# Patient Record
Sex: Female | Born: 1985 | Race: White | Hispanic: No | Marital: Married | Smoking: Never smoker
Health system: Southern US, Community
[De-identification: ages and names within clinical notes are randomized; demographics above are authoritative.]

## PROBLEM LIST (undated history)

## (undated) ENCOUNTER — Inpatient Hospital Stay: Payer: Self-pay

## (undated) DIAGNOSIS — O26843 Uterine size-date discrepancy, third trimester: Secondary | ICD-10-CM

## (undated) DIAGNOSIS — J309 Allergic rhinitis, unspecified: Secondary | ICD-10-CM

## (undated) DIAGNOSIS — Z98891 History of uterine scar from previous surgery: Secondary | ICD-10-CM

## (undated) DIAGNOSIS — J45909 Unspecified asthma, uncomplicated: Secondary | ICD-10-CM

## (undated) DIAGNOSIS — T8859XA Other complications of anesthesia, initial encounter: Secondary | ICD-10-CM

## (undated) DIAGNOSIS — K851 Biliary acute pancreatitis without necrosis or infection: Secondary | ICD-10-CM

## (undated) DIAGNOSIS — R945 Abnormal results of liver function studies: Secondary | ICD-10-CM

## (undated) DIAGNOSIS — K219 Gastro-esophageal reflux disease without esophagitis: Secondary | ICD-10-CM

## (undated) DIAGNOSIS — Z9049 Acquired absence of other specified parts of digestive tract: Secondary | ICD-10-CM

## (undated) DIAGNOSIS — K810 Acute cholecystitis: Secondary | ICD-10-CM

## (undated) DIAGNOSIS — T4145XA Adverse effect of unspecified anesthetic, initial encounter: Secondary | ICD-10-CM

## (undated) HISTORY — DX: Biliary acute pancreatitis without necrosis or infection: K85.10

## (undated) HISTORY — DX: Uterine size-date discrepancy, third trimester: O26.843

## (undated) HISTORY — PX: BREAST REDUCTION SURGERY: SHX8

## (undated) HISTORY — DX: Acquired absence of other specified parts of digestive tract: Z90.49

## (undated) HISTORY — PX: BREAST SURGERY: SHX581

## (undated) HISTORY — DX: Acute cholecystitis: K81.0

## (undated) HISTORY — DX: Abnormal results of liver function studies: R94.5

## (undated) HISTORY — DX: Allergic rhinitis, unspecified: J30.9

## (undated) HISTORY — DX: History of uterine scar from previous surgery: Z98.891

## (undated) HISTORY — PX: HAND TENDON SURGERY: SHX663

---

## 2010-12-02 ENCOUNTER — Emergency Department (HOSPITAL_COMMUNITY)
Admission: EM | Admit: 2010-12-02 | Discharge: 2010-12-02 | Payer: Self-pay | Source: Home / Self Care | Admitting: Emergency Medicine

## 2011-07-14 DIAGNOSIS — Z7189 Other specified counseling: Secondary | ICD-10-CM | POA: Insufficient documentation

## 2011-07-14 DIAGNOSIS — J309 Allergic rhinitis, unspecified: Secondary | ICD-10-CM

## 2011-07-14 HISTORY — DX: Allergic rhinitis, unspecified: J30.9

## 2012-05-24 ENCOUNTER — Inpatient Hospital Stay: Payer: Self-pay | Admitting: Obstetrics and Gynecology

## 2012-05-25 LAB — CBC WITH DIFFERENTIAL/PLATELET
Eosinophil #: 0.3 10*3/uL (ref 0.0–0.7)
Eosinophil %: 2.8 %
HCT: 35.6 % (ref 35.0–47.0)
Lymphocyte #: 3.2 10*3/uL (ref 1.0–3.6)
MCHC: 34.3 g/dL (ref 32.0–36.0)
MCV: 96 fL (ref 80–100)
Monocyte #: 0.6 x10 3/mm (ref 0.2–0.9)
Monocyte %: 6.2 %
Neutrophil #: 6.3 10*3/uL (ref 1.4–6.5)
RBC: 3.73 10*6/uL — ABNORMAL LOW (ref 3.80–5.20)
RDW: 13.5 % (ref 11.5–14.5)
WBC: 10.4 10*3/uL (ref 3.6–11.0)

## 2012-05-26 LAB — HEMATOCRIT: HCT: 31.8 % — ABNORMAL LOW (ref 35.0–47.0)

## 2012-05-31 LAB — PATHOLOGY REPORT

## 2014-03-13 ENCOUNTER — Encounter: Payer: Self-pay | Admitting: Podiatry

## 2014-03-13 ENCOUNTER — Ambulatory Visit (INDEPENDENT_AMBULATORY_CARE_PROVIDER_SITE_OTHER): Payer: BC Managed Care – PPO | Admitting: Podiatry

## 2014-03-13 VITALS — Ht 65.0 in | Wt 200.0 lb

## 2014-03-13 DIAGNOSIS — B079 Viral wart, unspecified: Secondary | ICD-10-CM

## 2014-03-13 NOTE — Progress Notes (Signed)
Subjective:     Patient ID: Lucienne MinksBrianne Turri, female   DOB: 04/03/1986, 28 y.o.   MRN: [id number]  HPI patient presents with painful lesion plantar aspect right foot that has been present for a couple months. [redacted] weeks pregnant and having trouble walking on this   Review of Systems  All other systems reviewed and are negative.       Objective:   Physical Exam  Nursing note and vitals reviewed. Constitutional: She is oriented to person, place, and time.  Cardiovascular: Intact distal pulses.   Musculoskeletal: Normal range of motion.  Neurological: She is oriented to person, place, and time.  Skin: Skin is warm.   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 Patient will reappoint if it continues to persist

## 2014-03-13 NOTE — Progress Notes (Signed)
   Subjective:    Patient ID: Amber Page, female    DOB: [birth year], 28 y.o.   MRN: [id number]  HPI Comments: Left foot pain , callus on the bottom of the 3rd met. She is [redacted] weeks pregnant. And the callus is making it hard to walk      Review of Systems  All other systems reviewed and are negative.       Objective:   Physical Exam        Assessment & Plan:

## 2014-04-16 ENCOUNTER — Ambulatory Visit: Payer: Self-pay | Admitting: Obstetrics and Gynecology

## 2014-04-16 LAB — CBC WITH DIFFERENTIAL/PLATELET
BASOS PCT: 0.4 %
Basophil #: 0 10*3/uL (ref 0.0–0.1)
Eosinophil #: 0.2 10*3/uL (ref 0.0–0.7)
Eosinophil %: 1.5 %
HCT: 36.8 % (ref 35.0–47.0)
HGB: 11.9 g/dL — ABNORMAL LOW (ref 12.0–16.0)
Lymphocyte #: 2.7 10*3/uL (ref 1.0–3.6)
Lymphocyte %: 23 %
MCH: 30.7 pg (ref 26.0–34.0)
MCHC: 32.4 g/dL (ref 32.0–36.0)
MCV: 95 fL (ref 80–100)
MONO ABS: 0.6 x10 3/mm (ref 0.2–0.9)
Monocyte %: 5.6 %
NEUTROS ABS: 8 10*3/uL — AB (ref 1.4–6.5)
NEUTROS PCT: 69.5 %
Platelet: 360 10*3/uL (ref 150–440)
RBC: 3.88 10*6/uL (ref 3.80–5.20)
RDW: 14.1 % (ref 11.5–14.5)
WBC: 11.6 10*3/uL — AB (ref 3.6–11.0)

## 2014-04-17 ENCOUNTER — Inpatient Hospital Stay: Payer: Self-pay

## 2014-04-17 LAB — CBC WITH DIFFERENTIAL/PLATELET
Basophil #: 0.1 10*3/uL (ref 0.0–0.1)
Basophil %: 0.4 %
EOS PCT: 1.4 %
Eosinophil #: 0.2 10*3/uL (ref 0.0–0.7)
HCT: 37.1 % (ref 35.0–47.0)
HGB: 12.6 g/dL (ref 12.0–16.0)
LYMPHS ABS: 2.7 10*3/uL (ref 1.0–3.6)
Lymphocyte %: 19.9 %
MCH: 32.3 pg (ref 26.0–34.0)
MCHC: 34.1 g/dL (ref 32.0–36.0)
MCV: 95 fL (ref 80–100)
Monocyte #: 0.8 x10 3/mm (ref 0.2–0.9)
Monocyte %: 6.1 %
NEUTROS ABS: 9.8 10*3/uL — AB (ref 1.4–6.5)
Neutrophil %: 72.2 %
Platelet: 328 10*3/uL (ref 150–440)
RBC: 3.92 10*6/uL (ref 3.80–5.20)
RDW: 14 % (ref 11.5–14.5)
WBC: 13.5 10*3/uL — ABNORMAL HIGH (ref 3.6–11.0)

## 2014-04-18 LAB — HEMATOCRIT: HCT: 31.2 % — AB (ref 35.0–47.0)

## 2014-10-19 ENCOUNTER — Encounter: Payer: Self-pay | Admitting: Podiatry

## 2015-04-11 NOTE — Op Note (Signed)
PATIENT NAME:  Amber Page, [name] MR#:  [id number] DATE OF BIRTH:  1986/10/15  DATE OF PROCEDURE:  05/25/2012  PREOPERATIVE DIAGNOSIS:  1. Intrauterine pregnancy at 39 weeks 0 days gestation.  2. Fetal macrosomia.  3. Chorioamnionitis.  4. Fetal intolerance to labor.   POSTOPERATIVE DIAGNOSIS:  1. Intrauterine pregnancy at 39 weeks 0 days gestation.  2. Fetal macrosomia.  3. Chorioamnionitis.  4. Fetal intolerance to labor.   PRIMARY SURGEON: Florina Oundreas M. Bonney AidStaebler, MD   ASSISTANT: Surgical Tech Kandice HamsAmanda Ore   ANESTHESIA: Spinal.   ESTIMATED BLOOD LOSS: 900 mL.   INTRAOPERATIVE FLUIDS: 1600 ML of crystalloid.   URINE OUTPUT: 100 mL.   INTRAOPERATIVE FINDINGS: Amber MartinLiveborn female infant weighing 4300 grams, 9 pounds 8 ounces, Apgars 9 and 9. The intra-abdominal anatomy, uterus, tubes, and ovaries are grossly unremarkable.   COMPLICATIONS: None.   PROCEDURE IN DETAIL: The risks, benefits, and alternatives of the procedure were discussed with the patient prior to proceeding to the Operating Room. The patient was suspected of carrying a macrosomic fetus and during the course of her labor developed clinical signs of chorioamnionitis. Her T-max was 100.3, but the fetus started displaying prolonged tachycardia with minimal variability and deep decelerations with pushing. The patient was allowed to push for approximately 40 minutes with no movement of the fetal vertex and station noted. Given that she was deemed remote from delivery, and delivery itself was not certain given the size of the uterus, after discussion with the mother the mutual decision was made to proceed with an operative cesarean delivery. She was taken back to the Operating Room and placed under spinal anesthesia. After the level of anesthesia was noted to be adequate, she was prepped and draped in the usual sterile fashion. A Pfannenstiel skin incision was made two fingerbreadths close to the pubic symphysis and carried down sharply to the  rectus fascia using a knife. The fascia was incised and the fascial incision extended using Mayo scissors. The superior border of the rectus fascia was grasped with two Kocher clamps. The underlying rectus muscle was dissected off the rectus fascia bluntly. The median raphe was incised using Mayo scissors. The inferior border of the rectus muscle was then grasped with the Kocher clamps and the rectus muscle and median raphe incised in a similar fashion. The midline was identified, grasped, and the peritoneum was tented up using a hemostat. The peritoneum was then entered using Metzenbaum scissors. The peritoneal incision was extended using manual traction. Next, a bladder blade was placed. The lower uterine segment was identified, and a bladder flap was created using Metzenbaum scissors and blunt dissection. The bladder blade was placed. Hysterotomy incision was made using a knife. The uterus was entered bluntly using the operator's finger. Hysterotomy incision was extended using manual traction. The operator's hand was placed into the uterus. The infant's vertex was grasped, flexed, brought to the incision, and delivered atraumatically using fundal pressure. The infant was suctioned, the cord was clamped and cut, and the infant was then passed to the awaiting pediatricians. The uterus was exteriorized and wiped clean of clots and debris. The hysterotomy incision was repaired using a two-layer closure of 0 Vicryl with the first layer being a running locked stitch, and the second imbricating layer a horizontal imbricating stitch was utilized. The uterus was then returned to the abdomen. The hysterotomy incision was reinspected and noted to be hemostatic. The peritoneal gutters were wiped clean of clots and debris using two moist laps. The peritoneum was then closed  using a 2-0 Vicryl in a running fashion. Next, the superior border of the rectus fascia was grasped with Kocher clamp. The On-Q trocars were introduced in  the midline approximately 3 cm above the superior border of the Pfannenstiel skin incision. The trocars were removed from the introducers, and the catheters were threaded through the introducers subfascially. Next, the fascia was closed using a looped #1 PDS in a running fashion. Hemostasis was achieved using the Bovie. Skin was closed using a 4-0 Monocryl in a subcuticular fashion. The On-Q catheters were then primed with 5 mL of 0.5% bupivacaine each. Sponge, needle, and instrument counts were correct x2. The patient tolerated the procedure well and was taken to the recovery room in stable condition.  ____________________________ Florina Ou. Bonney Aid, MD ams:cbb D: 05/27/2012 09:40:28 ET T: 05/27/2012 11:34:32 ET JOB#: [id number] Lorrene Reid MD ELECTRONICALLY SIGNED 05/28/2012 22:03

## 2015-04-27 NOTE — H&P (Signed)
L&D Evaluation:  History Expanded:  HPI [name], EDD of 04/16/14 per 8 wk US, presents at 3447w1d with c/o ctx since 0230 this am. Pt with h/o LTCS on of 9# 8oz fetus on 05/25/12 after 2nd stage arrest after 2 hours of pushing, FITL and chorioamnionitis. Pt also had PP wound dehiscence. Pt desires TOLAC, VBAC consent signed at the office on 12/30/13. [facility], EFW at 35 wks: 7#7oz (85%). TWG this pregnancy: 32 lbs (most recent weight showed 10 lb drop) TDAP given in pregnancy.   Blood Type (Maternal) O positive   Group B Strep Results Maternal (Result >5wks must be treated as unknown) positive   Maternal HIV Negative   Maternal Syphilis Ab Nonreactive   Maternal Varicella Immune   Rubella Results (Maternal) immune   Maternal T-Dap Immune   Presents with contractions   Patient's Medical History Asthma   Patient's Surgical History Previous C-Section  Breast reduction, hand surgery   Medications Pre Natal Vitamins  Advair, Zantac   Allergies NKDA   Social History none   Exam:  Vital Signs BP 149/92, 131/75    General no apparent distress   Mental Status clear   Chest clear   Heart no murmur/gallop/rubs   Abdomen gravid, tender with contractions   Estimated Fetal Weight Large for gestational age   Pelvic 6 cm   Mebranes Intact   FHT normal rate with no decels   Ucx regular   Impression:  Impression active labor   Plan:  Comments Pt strongly desires Trial of Labor, Initiate VBAC protocol -MD, anesthesia and OR will be made aware. Pt desiring natural labor, husband and doula offering support. Discussed continuous monitoring with the exception of bathroom privileges, positions changes are fine.  NPO Ampicillin for +GBS at approximately 0630   Electronic Signatures: Amber Page, Amber Page (CNM)  (Signed 01-May-15 07:07)  Authored: L&D Evaluation   Last Updated: 01-May-15 07:07 by Amber Page, Amber Page (CNM)

## 2015-05-31 LAB — OB RESULTS CONSOLE RUBELLA ANTIBODY, IGM: RUBELLA: IMMUNE

## 2015-05-31 LAB — OB RESULTS CONSOLE GC/CHLAMYDIA
Chlamydia: NEGATIVE
GC PROBE AMP, GENITAL: NEGATIVE

## 2015-05-31 LAB — OB RESULTS CONSOLE VARICELLA ZOSTER ANTIBODY, IGG: VARICELLA IGG: IMMUNE

## 2015-05-31 LAB — OB RESULTS CONSOLE HEPATITIS B SURFACE ANTIGEN: HEP B S AG: NEGATIVE

## 2015-09-09 ENCOUNTER — Other Ambulatory Visit: Payer: Self-pay | Admitting: Obstetrics and Gynecology

## 2015-09-09 DIAGNOSIS — R1031 Right lower quadrant pain: Secondary | ICD-10-CM

## 2015-09-10 ENCOUNTER — Ambulatory Visit
Admission: RE | Admit: 2015-09-10 | Discharge: 2015-09-10 | Disposition: A | Discharge status: Home / Self Care | Payer: BC Managed Care – PPO | Source: Ambulatory Visit | Attending: Obstetrics and Gynecology | Admitting: Obstetrics and Gynecology

## 2015-09-10 DIAGNOSIS — Z3A19 19 weeks gestation of pregnancy: Secondary | ICD-10-CM | POA: Diagnosis not present

## 2015-09-10 DIAGNOSIS — O26892 Other specified pregnancy related conditions, second trimester: Secondary | ICD-10-CM | POA: Diagnosis not present

## 2015-09-10 DIAGNOSIS — R1031 Right lower quadrant pain: Secondary | ICD-10-CM | POA: Insufficient documentation

## 2015-10-29 ENCOUNTER — Encounter: Payer: Self-pay | Admitting: *Deleted

## 2015-10-29 ENCOUNTER — Emergency Department
Admission: EM | Admit: 2015-10-29 | Discharge: 2015-10-29 | Disposition: A | Discharge status: Home / Self Care | Payer: BC Managed Care – PPO | Attending: Emergency Medicine | Admitting: Emergency Medicine

## 2015-10-29 ENCOUNTER — Emergency Department: Payer: BC Managed Care – PPO

## 2015-10-29 DIAGNOSIS — Z7951 Long term (current) use of inhaled steroids: Secondary | ICD-10-CM | POA: Insufficient documentation

## 2015-10-29 DIAGNOSIS — Z79899 Other long term (current) drug therapy: Secondary | ICD-10-CM | POA: Diagnosis not present

## 2015-10-29 DIAGNOSIS — Y9289 Other specified places as the place of occurrence of the external cause: Secondary | ICD-10-CM | POA: Insufficient documentation

## 2015-10-29 DIAGNOSIS — W1849XA Other slipping, tripping and stumbling without falling, initial encounter: Secondary | ICD-10-CM | POA: Diagnosis not present

## 2015-10-29 DIAGNOSIS — Y998 Other external cause status: Secondary | ICD-10-CM | POA: Insufficient documentation

## 2015-10-29 DIAGNOSIS — Y9301 Activity, walking, marching and hiking: Secondary | ICD-10-CM | POA: Diagnosis not present

## 2015-10-29 DIAGNOSIS — S93492A Sprain of other ligament of left ankle, initial encounter: Secondary | ICD-10-CM | POA: Diagnosis not present

## 2015-10-29 DIAGNOSIS — S99912A Unspecified injury of left ankle, initial encounter: Secondary | ICD-10-CM | POA: Diagnosis present

## 2015-10-29 HISTORY — DX: Unspecified asthma, uncomplicated: J45.909

## 2015-10-29 NOTE — ED Notes (Signed)
Ice applied. Ankle elevated

## 2015-10-29 NOTE — Discharge Instructions (Signed)
Ankle Sprain °An ankle sprain is an injury to the strong, fibrous tissues (ligaments) that hold the bones of your ankle joint together.  °CAUSES °An ankle sprain is usually caused by a fall or by twisting your ankle. Ankle sprains most commonly occur when you step on the outer edge of your foot, and your ankle turns inward. People who participate in sports are more prone to these types of injuries.  °SYMPTOMS  °· Pain in your ankle. The pain may be present at rest or only when you are trying to stand or walk. °· Swelling. °· Bruising. Bruising may develop immediately or within 1 to 2 days after your injury. °· Difficulty standing or walking, particularly when turning corners or changing directions. °DIAGNOSIS  °Your caregiver will ask you details about your injury and perform a physical exam of your ankle to determine if you have an ankle sprain. During the physical exam, your caregiver will press on and apply pressure to specific areas of your foot and ankle. Your caregiver will try to move your ankle in certain ways. An X-ray exam may be done to be sure a bone was not broken or a ligament did not separate from one of the bones in your ankle (avulsion fracture).  °TREATMENT  °Certain types of braces can help stabilize your ankle. Your caregiver can make a recommendation for this. Your caregiver may recommend the use of medicine for pain. If your sprain is severe, your caregiver may refer you to a surgeon who helps to restore function to parts of your skeletal system (orthopedist) or a physical therapist. °HOME CARE INSTRUCTIONS  °· Apply ice to your injury for 1-2 days or as directed by your caregiver. Applying ice helps to reduce inflammation and pain. °· Put ice in a plastic bag. °· Place a towel between your skin and the bag. °· Leave the ice on for 15-20 minutes at a time, every 2 hours while you are awake. °· Only take over-the-counter or prescription medicines for pain, discomfort, or fever as directed by  your caregiver. °· Elevate your injured ankle above the level of your heart as much as possible for 2-3 days. °· If your caregiver recommends crutches, use them as instructed. Gradually put weight on the affected ankle. Continue to use crutches or a cane until you can walk without feeling pain in your ankle. °· If you have a plaster splint, wear the splint as directed by your caregiver. Do not rest it on anything harder than a pillow for the first 24 hours. Do not put weight on it. Do not get it wet. You may take it off to take a shower or bath. °· You may have been given an elastic bandage to wear around your ankle to provide support. If the elastic bandage is too tight (you have numbness or tingling in your foot or your foot becomes cold and blue), adjust the bandage to make it comfortable. °· If you have an air splint, you may blow more air into it or let air out to make it more comfortable. You may take your splint off at night and before taking a shower or bath. Wiggle your toes in the splint several times per day to decrease swelling. °SEEK MEDICAL CARE IF:  °· You have rapidly increasing bruising or swelling. °· Your toes feel extremely cold or you lose feeling in your foot. °· Your pain is not relieved with medicine. °SEEK IMMEDIATE MEDICAL CARE IF: °· Your toes are numb or blue. °·   You have severe pain that is increasing. °MAKE SURE YOU:  °· Understand these instructions. °· Will watch your condition. °· Will get help right away if you are not doing well or get worse. °  °This information is not intended to replace advice given to you by your health care provider. Make sure you discuss any questions you have with your health care provider. °  °Document Released: 12/04/2005 Document Revised: 12/25/2014 Document Reviewed: 12/16/2011 °Elsevier Interactive Patient Education ©2016 Elsevier Inc. ° °Cryotherapy °Cryotherapy means treatment with cold. Ice or gel packs can be used to reduce both pain and swelling.  Ice is the most helpful within the first 24 to 48 hours after an injury or flare-up from overusing a muscle or joint. Sprains, strains, spasms, burning pain, shooting pain, and aches can all be eased with ice. Ice can also be used when recovering from surgery. Ice is effective, has very few side effects, and is safe for most people to use. °PRECAUTIONS  °Ice is not a safe treatment option for people with: °· Raynaud phenomenon. This is a condition affecting small blood vessels in the extremities. Exposure to cold may cause your problems to return. °· Cold hypersensitivity. There are many forms of cold hypersensitivity, including: °¨ Cold urticaria. Red, itchy hives appear on the skin when the tissues begin to warm after being iced. °¨ Cold erythema. This is a red, itchy rash caused by exposure to cold. °¨ Cold hemoglobinuria. Red blood cells break down when the tissues begin to warm after being iced. The hemoglobin that carry oxygen are passed into the urine because they cannot combine with blood proteins fast enough. °· Numbness or altered sensitivity in the area being iced. °If you have any of the following conditions, do not use ice until you have discussed cryotherapy with your caregiver: °· Heart conditions, such as arrhythmia, angina, or chronic heart disease. °· High blood pressure. °· Healing wounds or open skin in the area being iced. °· Current infections. °· Rheumatoid arthritis. °· Poor circulation. °· Diabetes. °Ice slows the blood flow in the region it is applied. This is beneficial when trying to stop inflamed tissues from spreading irritating chemicals to surrounding tissues. However, if you expose your skin to cold temperatures for too long or without the proper protection, you can damage your skin or nerves. Watch for signs of skin damage due to cold. °HOME CARE INSTRUCTIONS °Follow these tips to use ice and cold packs safely. °· Place a dry or damp towel between the ice and skin. A damp towel will  cool the skin more quickly, so you may need to shorten the time that the ice is used. °· For a more rapid response, add gentle compression to the ice. °· Ice for no more than 10 to 20 minutes at a time. The bonier the area you are icing, the less time it will take to get the benefits of ice. °· Check your skin after 5 minutes to make sure there are no signs of a poor response to cold or skin damage. °· Rest 20 minutes or more between uses. °· Once your skin is numb, you can end your treatment. You can test numbness by very lightly touching your skin. The touch should be so light that you do not see the skin dimple from the pressure of your fingertip. When using ice, most people will feel these normal sensations in this order: cold, burning, aching, and numbness. °· Do not use ice on someone who   cannot communicate their responses to pain, such as small children or people with dementia. °HOW TO MAKE AN ICE PACK °Ice packs are the most common way to use ice therapy. Other methods include ice massage, ice baths, and cryosprays. Muscle creams that cause a cold, tingly feeling do not offer the same benefits that ice offers and should not be used as a substitute unless recommended by your caregiver. °To make an ice pack, do one of the following: °· Place crushed ice or a bag of frozen vegetables in a sealable plastic bag. Squeeze out the excess air. Place this bag inside another plastic bag. Slide the bag into a pillowcase or place a damp towel between your skin and the bag. °· Mix 3 parts water with 1 part rubbing alcohol. Freeze the mixture in a sealable plastic bag. When you remove the mixture from the freezer, it will be slushy. Squeeze out the excess air. Place this bag inside another plastic bag. Slide the bag into a pillowcase or place a damp towel between your skin and the bag. °SEEK MEDICAL CARE IF: °· You develop white spots on your skin. This may give the skin a blotchy (mottled) appearance. °· Your skin turns  blue or pale. °· Your skin becomes waxy or hard. °· Your swelling gets worse. °MAKE SURE YOU:  °· Understand these instructions. °· Will watch your condition. °· Will get help right away if you are not doing well or get worse. °  °This information is not intended to replace advice given to you by your health care provider. Make sure you discuss any questions you have with your health care provider. °  °Document Released: 07/31/2011 Document Revised: 12/25/2014 Document Reviewed: 07/31/2011 °Elsevier Interactive Patient Education ©2016 Elsevier Inc. ° °Stirrup Ankle Brace °Stirrup ankle braces give support and help stabilize the ankle joint. They are rigid pieces of plastic or fiberglass that go up both sides of the lower leg with the bottom of the stirrup fitting comfortably under the bottom of the instep of the foot. It can be held on with Velcro straps or an elastic wrap. Stirrup ankle braces are used to support the ankle following mild or moderate sprains or strains, or fractures after cast removal.  °They can be easily removed or adjusted if there is swelling. The rigid brace shells are designed to fit the ankle comfortably and provide the needed medial/lateral stabilization. This brace can be easily worn with most athletic shoes. The brace liner is usually made of a soft, comfortable gel-like material. This gel fits the ankle well without causing uncomfortable pressure points.  °IMPORTANCE OF ANKLE BRACES: °· The use of ankle bracing is effective in the prevention of ankle sprains. °· In athletes, the use of ankle bracing will offer protection and prevent further sprains. °· Research shows that a complete rehabilitation program needs to be included with external bracing. This includes range of motion and ankle strengthening exercises. Your caregivers will instruct you in this. °If you were given the brace today for a new injury, use the following home care instructions as a guide. °HOME CARE INSTRUCTIONS   °· Apply ice to the sore area for 15-20 minutes, 03-04 times per day while awake for the first 2 days. Put the ice in a plastic bag and place a towel between the bag of ice and your skin. Never place the ice pack directly on your skin. Be especially careful using ice on an elbow or knee or other bony area, such as   your ankle, because icing for too long may damage the nerves which are close to the surface. °· Keep your leg elevated when possible to lessen swelling. °· Wear your splint until you are seen for a follow-up examination. Do not put weight on it. Do not get it wet. You may take it off to take a shower or bath. °· For Activity: Use crutches with non-weight bearing for 1 week. Then, you may walk on your ankle as instructed. Start gradually with weight bearing on the affected ankle. °· Continue to use crutches or a cane until you can stand on your ankle without causing pain. °· Wiggle your toes in the splint several times per day if you are able. °· The splint is too tight if you have numbness, tingling, or if your foot becomes cold and blue. Adjust the straps or elastic bandage to make it comfortable. °· Only take over-the-counter or prescription medicines for pain, discomfort, or fever as directed by your caregiver. °SEEK IMMEDIATE MEDICAL CARE IF:  °· You have increased bruising, swelling or pain. °· Your toes are blue or cold and loosening the brace or wrap does not help. °· Your pain is not relieved with medicine. °MAKE SURE YOU:  °· Understand these instructions. °· Will watch your condition. °· Will get help right away if you are not doing well or get worse. °  °This information is not intended to replace advice given to you by your health care provider. Make sure you discuss any questions you have with your health care provider. °  °Document Released: 10/04/2004 Document Revised: 02/26/2012 Document Reviewed: 07/06/2015 °Elsevier Interactive Patient Education ©2016 Elsevier Inc. ° °Elastic Bandage and  RICE °WHAT DOES AN ELASTIC BANDAGE DO? °Elastic bandages come in different shapes and sizes. They generally provide support to your injury and reduce swelling while you are healing, but they can perform different functions. Your health care provider will help you to decide what is best for your protection, recovery, or rehabilitation following an injury. °WHAT ARE SOME GENERAL TIPS FOR USING AN ELASTIC BANDAGE? °· Use the bandage as directed by the maker of the bandage that you are using. °· Do not wrap the bandage too tightly. This may cut off the circulation in the arm or leg in the area below the bandage. °¨ If part of your body beyond the bandage becomes blue, numb, cold, swollen, or is more painful, your bandage is most likely too tight. If this occurs, remove your bandage and reapply it more loosely. °· See your health care provider if the bandage seems to be making your problems worse rather than better. °· An elastic bandage should be removed and reapplied every 3-4 hours or as directed by your health care provider. °WHAT IS RICE? °The routine care of many injuries includes rest, ice, compression, and elevation (RICE therapy).  °Rest °Rest is required to allow your body to heal. Generally, you can resume your routine activities when you are comfortable and have been given permission by your health care provider. °Ice °Icing your injury helps to keep the swelling down and it reduces pain. Do not apply ice directly to your skin. °· Put ice in a plastic bag. °· Place a towel between your skin and the bag. °· Leave the ice on for 20 minutes, 2-3 times per day. °Do this for as long as you are directed by your health care provider. °Compression °Compression helps to keep swelling down, gives support, and helps with discomfort. Compression may   be done with an elastic bandage. Elevation Elevation helps to reduce swelling and it decreases pain. If possible, your injured area should be placed at or above the level  of your heart or the center of your chest. WHEN SHOULD I SEEK MEDICAL CARE? You should seek medical care if:  You have persistent pain and swelling.  Your symptoms are getting worse rather than improving. These symptoms may indicate that further evaluation or further X-rays are needed. Sometimes, X-rays may not show a small broken bone (fracture) until a number of days later. Make a follow-up appointment with your health care provider. Ask when your X-ray results will be ready. Make sure that you get your X-ray results. WHEN SHOULD I SEEK IMMEDIATE MEDICAL CARE? You should seek immediate medical care if:  You have a sudden onset of severe pain at or below the area of your injury.  You develop redness or increased swelling around your injury.  You have tingling or numbness at or below the area of your injury that does not improve after you remove the elastic bandage.   This information is not intended to replace advice given to you by your health care provider. Make sure you discuss any questions you have with your health care provider.   Document Released: 05/26/2002 Document Revised: 08/25/2015 Document Reviewed: 07/20/2014 Elsevier Interactive Patient Education [employer]2016 Else[employer] Inc.

## 2015-10-29 NOTE — ED Notes (Signed)
Pt stepped in grass, twisted L ankle and heard a snap. Pt presents w/ c/o L ankle pain, swelling, unable to bear.

## 2015-10-29 NOTE — ED Provider Notes (Signed)
[facility] Emergency Department Provider Note  ____________________________________________  Time seen: Approximately 9:10 PM  I have reviewed the triage vital signs and the nursing notes.   HISTORY  Chief Complaint Ankle Injury    HPI Amber Page is a 29 y.o. female who presents emergency department complaining of left ankle pain. She states that she was walking this evening through some high grass when she stepped "wrong" and states that she inverted her left ankle. She states that she heard and felt a "snapping sensation". She experienced immediate pain and edema to the lateral malleolus. The patient was unable to bear weight at time of injury. She denies any numbness or tingling. She denies any other injury. The pain is constant, sharp, worsened with palpation or weightbearing. Patient has not taken medication prior to arrival.   Past Medical History  Diagnosis Date  . Asthma     There are no active problems to display for this patient.   Past Surgical History  Procedure Laterality Date  . Cesarean section      x 2  . Breast surgery      Current Outpatient Rx  Name  Route  Sig  Dispense  Refill  . Fluticasone-Salmeterol (ADVAIR) 250-50 MCG/DOSE AEPB   Inhalation   Inhale 1 puff into the lungs 2 (two) times daily.         . Prenatal Multivit-Min-Fe-FA (PRENATAL VITAMINS PO)   Oral   Take by mouth.         . ranitidine (ZANTAC) 75 MG tablet   Oral   Take 75 mg by mouth 2 (two) times daily.           Allergies Review of patient's allergies indicates no known allergies.  History reviewed. No pertinent family history.  Social History Social History  Substance Use Topics  . Smoking status: Never Smoker   . Smokeless tobacco: Never Used  . Alcohol Use: No    Review of Systems Constitutional: No fever/chills Eyes: No visual changes. ENT: No sore throat. Cardiovascular: Denies chest pain. Respiratory: Denies shortness of  breath. Gastrointestinal: No abdominal pain.  No nausea, no vomiting.  No diarrhea.  No constipation. Genitourinary: Negative for dysuria. Musculoskeletal: Negative for back pain. Endorses left ankle pain. Skin: Negative for rash. Neurological: Negative for headaches, focal weakness or numbness.  10-point ROS otherwise negative.  ____________________________________________   PHYSICAL EXAM:  VITAL SIGNS: ED Triage Vitals  Enc Vitals Group     BP 10/29/15 2022 144/83 mmHg     Pulse Rate 10/29/15 2022 107     Resp 10/29/15 2022 22     Temp 10/29/15 2022 98 F (36.7 C)     Temp Source 10/29/15 2022 Oral     SpO2 10/29/15 2022 100 %     Weight --      Height --      Head Cir --      Peak Flow --      Pain Score 10/29/15 2018 9     Pain Loc --      Pain Edu? --      Excl. in GC? --     Constitutional: Alert and oriented. Well appearing and in no acute distress. Eyes: Conjunctivae are normal. PERRL. EOMI. Head: Atraumatic. Nose: No congestion/rhinnorhea. Mouth/Throat: Mucous membranes are moist.  Oropharynx non-erythematous. Neck: No stridor.   Cardiovascular: Normal rate, regular rhythm. Grossly normal heart sounds.  Good peripheral circulation. Respiratory: Normal respiratory effort.  No retractions. Lungs CTAB. Gastrointestinal:  Soft and nontender. No distention. No abdominal bruits. No CVA tenderness. Musculoskeletal: Edema of the left ankle is noted when compared with the right side. Edema is located on the lateral malleolus. No visible deformity noted. Patient is tender to palpation over the lateral malleolus as well as surrounding soft tissue. No tenderness to palpation over the base of the fifth metatarsal. Sensation and pulses intact distally. Neurologic:  Normal speech and language. No gross focal neurologic deficits are appreciated. No gait instability. Skin:  Skin is warm, dry and intact. No rash noted. Psychiatric: Mood and affect are normal. Speech and behavior  are normal.  ____________________________________________   LABS (all labs ordered are listed, but only abnormal results are displayed)  Labs Reviewed - No data to display ____________________________________________  EKG   ____________________________________________  RADIOLOGY  Left ankle x-ray Impression: No acute fracture, dislocation, or joint effusion. ____________________________________________   PROCEDURES  Procedure(s) performed: None  Critical Care performed: No  ____________________________________________   INITIAL IMPRESSION / ASSESSMENT AND PLAN / ED COURSE  Pertinent labs & imaging results that were available during my care of the patient were reviewed by me and considered in my medical decision making (see chart for details).  I discussed the findings with the patient and likely diagnosis of posterior talofibular ankle sprain. The patient is pregnant and I advised her to take Tylenol as symptom control medication. Possible options of immobilization were discussed the patient and the patient opts for over-the-counter brace. Patient is advised to follow-up with orthopedics for further evaluation and treatment and she verbalizes compliance with this plan. ____________________________________________   FINAL CLINICAL IMPRESSION(S) / ED DIAGNOSES  Final diagnoses:  Sprain of posterior talofibular ligament of ankle, left, initial encounter      Racheal Patches, PA-C 10/30/15 0040  Jennye Moccasin, MD 10/31/15 1318

## 2015-12-07 ENCOUNTER — Encounter: Payer: BC Managed Care – PPO | Attending: Obstetrics and Gynecology | Admitting: Dietician

## 2015-12-07 ENCOUNTER — Encounter: Payer: Self-pay | Admitting: Dietician

## 2015-12-07 VITALS — Ht 65.0 in | Wt 221.3 lb

## 2015-12-07 DIAGNOSIS — E669 Obesity, unspecified: Secondary | ICD-10-CM

## 2015-12-07 NOTE — Patient Instructions (Signed)
Balance breakfast with 2 servings of carbohydrate (starch and milk group; no fruit) and protein.  Balance lunch and dinner with 3-4 servings of carbohydrate, protein and non-starchy vegetables. Estimate carbohydrate servings at meals and snacks. Measure some portions of carbohydrate foods, especially starches. Read labels for carbohydrate grams remembering that 15 gms of carbohydrate equals one serving. Include a starch or fruit and a protein for afternoon snack. Discontinue beverages with sugar. Use regular oatmeal with nuts added.

## 2015-12-07 NOTE — Progress Notes (Signed)
Medical Nutrition Therapy: Visit start time: 1315  end time: 1420 Assessment:  Diagnosis: obesity Past medical history: Patient is [redacted] weeks gestation; has 3 yr.old and 5420 month old. GGT had 1 abnormal value and value at 1 hour was 170 (highest number without being abnormal) Psychosocial issues/ stress concerns: Patient describes her stress as "moderate" and indicates "very well" as to how she is dealing with her stress. Preferred learning method:  . Auditory . Hands-on Current weight: 221.2 lbs  Height: 65 in Medications, supplements: see list Progress and evaluation:  Patient in for initial nutrition assessment visit. 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 She is on holiday break presently but plans to return to school for 5 weeks after break. She reports that she has made an effort to make healthier food choices and based on information given, she is making some healthy choices at meal times. A problem area is when she comes in from school and states "I snack on anything available- chips, cookies, etc." She also includes sweetened coffee and at least 1 glass of sweet tea daily. She also eats some type of sweet snack 2 times per day. Physical activity: none  Dietary Intake:  Usual eating pattern includes 3 meals and 2 snacks per day. Dining out frequency: 0-2 meals per week.  Breakfast: oatmeal (maple/br. Sugar) 1 pkg, flavored coffee or egg/cheese biscuit Snack: cottage cheese/fruit Lunch: salad or soup or peanut butter/jelly sandwich with fruit or yogurt, chips or cookie, water or sweet tea Snack: chips, cookies, water Supper: usually chicken with pasta or sweet potato and vegetables or eggs and toast,  Snack: usually no snack after dinner Beverages: coffee, water, tea  Nutrition Care Education: Borderline Gestational diabetes: Instructed on a meal plan based on  2000 calories including dietary principles for gestational diabetes, carbohydrate counting and the need to balance carbohydrate, protein, fat and non-starchy vegetables.Gave and reviewed menu examples. Discussed snack choices in afternoon that would have less effect on blood sugar. Food Safety: reviewed ways to lower risk of food borne illness and listeriosis.    Nutritional Diagnosis:  NI-5.8.2 Excessive carbohydrate intake As related to frequent carbohydrate snacks and sweetened beverages.  As evidenced by diet history.  Intervention:  Balance breakfast with 2 servings of carbohydrate (starch and milk group; no fruit) and protein.  Balance lunch and dinner with 3-4 servings of carbohydrate, protein and non-starchy vegetables. Estimate carbohydrate servings at meals and snacks. Measure some portions of carbohydrate foods, especially starches. Read labels for carbohydrate grams remembering that 15 gms of carbohydrate equals one serving. Include a starch or fruit and a protein for afternoon snack. Discontinue beverages with sugar. Use regular oatmeal with nuts added.  Education Materials given:  . General diet guidelines for Gestational Diabetes . Food lists/ Planning A Balanced Meal . Sample meal pattern/ menus . Goals/ instructions Learner/ who was taught:  . Patient  Level of understanding: . Partial understanding; needs review/ practice Learning barriers: . None Willingness to learn/ readiness for change: . Eager, change in progress  Monitoring and Evaluation:  No follow-up scheduled. Patient to call if desires further help with diet/nutrition.

## 2016-01-01 ENCOUNTER — Encounter: Payer: Self-pay | Admitting: *Deleted

## 2016-01-01 ENCOUNTER — Observation Stay
Admission: EM | Admit: 2016-01-01 | Discharge: 2016-01-01 | Disposition: A | Discharge status: Home / Self Care | Payer: BC Managed Care – PPO | Attending: Obstetrics & Gynecology | Admitting: Obstetrics & Gynecology

## 2016-01-01 DIAGNOSIS — O98519 Other viral diseases complicating pregnancy, unspecified trimester: Principal | ICD-10-CM | POA: Insufficient documentation

## 2016-01-01 DIAGNOSIS — Z3A00 Weeks of gestation of pregnancy not specified: Secondary | ICD-10-CM | POA: Insufficient documentation

## 2016-01-01 DIAGNOSIS — O219 Vomiting of pregnancy, unspecified: Secondary | ICD-10-CM

## 2016-01-01 LAB — URINALYSIS COMPLETE WITH MICROSCOPIC (ARMC ONLY)
Bilirubin Urine: NEGATIVE
Glucose, UA: NEGATIVE mg/dL
Hgb urine dipstick: NEGATIVE
Nitrite: NEGATIVE
PH: 5 (ref 5.0–8.0)
PROTEIN: 30 mg/dL — AB
Specific Gravity, Urine: 1.024 (ref 1.005–1.030)

## 2016-01-01 MED ORDER — LACTATED RINGERS IV SOLN
Freq: Once | INTRAVENOUS | Status: AC
  Administered 2016-01-01: 08:00:00 via INTRAVENOUS

## 2016-01-01 NOTE — OB Triage Note (Signed)
N/V started Wednesday. Denies N/V Friday. N/V returned today. Denies N/V during pregnancy. Reports loose stools as well. Reports household having the stomach bug. Elaina HoopsElks, Isam Unrein S

## 2016-01-01 NOTE — Discharge Summary (Signed)
Physician Discharge Summary  Patient ID: Amber Page MRN: [id number] DOB/AGE: [birth year] 29 y.o.  Admit date: 01/01/2016 Discharge date: 01/01/2016  Admission Diagnoses:  Nausea and Vomiting during pregnancy secondary to viral illness  Discharge Diagnoses:  Active Problems:   Nausea and vomiting during pregnancy   Discharged Condition: good  Hospital Course: Observation, Fetal monitoring  Significant Diagnostic Studies:  Urinalysis    Component Value Date/Time   COLORURINE AMBER* 01/01/2016 0828   APPEARANCEUR HAZY* 01/01/2016 0828   LABSPEC 1.024 01/01/2016 0828   PHURINE 5.0 01/01/2016 0828   GLUCOSEU NEGATIVE 01/01/2016 0828   HGBUR NEGATIVE 01/01/2016 0828   BILIRUBINUR NEGATIVE 01/01/2016 0828   KETONESUR 1+* 01/01/2016 0828   PROTEINUR 30* 01/01/2016 0828   NITRITE NEGATIVE 01/01/2016 0828   LEUKOCYTESUR TRACE* 01/01/2016 0828    Treatments: IV fluids- 1000 ml LR  Discharge Exam: Blood pressure 116/74, pulse 98, temperature 98.1 F (36.7 C), temperature source Oral, resp. rate 16, height 5\' 5"  (1.651 m), weight 97.523 kg (215 lb), SpO2 97 %. General: Alert, Oriented, NAD HR: RRR, no murmurs Lungs: CTA bilaterally, no additional work of breathing Extremities: no evidence of DVT, no edema Abdomen: Gravid, non tender Toco: Occasional contractions Fetal Well Being: Baseline 140, moderate variability, + accelerations, variable x2  Disposition: 01-Home or Self Care  Fetal well being: reassuring, Cat 1 tracing      Medication List    TAKE these medications        albuterol 108 (90 Base) MCG/ACT inhaler  Commonly known as:  PROVENTIL HFA;VENTOLIN HFA  Inhale 2 puffs into the lungs every 6 (six) hours as needed for wheezing or shortness of breath.     Fluticasone-Salmeterol 250-50 MCG/DOSE Aepb  Commonly known as:  ADVAIR  Inhale 1 puff into the lungs 2 (two) times daily.     PRENATAL VITAMINS PO  Take by mouth.     ranitidine 75 MG tablet  Commonly  known as:  ZANTAC  Take 75 mg by mouth 2 (two) times daily.         SignedTresea Mall: Adaeze Better 01/01/2016, 11:09 AM   This patient and plan were discussed with Dr Elesa MassedWard 01/01/2016

## 2016-01-11 LAB — OB RESULTS CONSOLE GBS: GBS: POSITIVE

## 2016-01-24 ENCOUNTER — Emergency Department
Admission: EM | Admit: 2016-01-24 | Discharge: 2016-01-24 | Disposition: A | Discharge status: Home / Self Care | Payer: BC Managed Care – PPO | Attending: Certified Nurse Midwife | Admitting: Certified Nurse Midwife

## 2016-01-24 ENCOUNTER — Observation Stay
Admission: EM | Admit: 2016-01-24 | Discharge: 2016-01-24 | Disposition: A | Discharge status: Home / Self Care | Payer: BC Managed Care – PPO | Source: Home / Self Care | Admitting: Obstetrics and Gynecology

## 2016-01-24 ENCOUNTER — Encounter: Payer: Self-pay | Admitting: Emergency Medicine

## 2016-01-24 DIAGNOSIS — IMO0002 Reserved for concepts with insufficient information to code with codable children: Secondary | ICD-10-CM | POA: Diagnosis present

## 2016-01-24 DIAGNOSIS — O368930 Maternal care for other specified fetal problems, third trimester, not applicable or unspecified: Secondary | ICD-10-CM | POA: Diagnosis not present

## 2016-01-24 DIAGNOSIS — J45901 Unspecified asthma with (acute) exacerbation: Secondary | ICD-10-CM | POA: Insufficient documentation

## 2016-01-24 DIAGNOSIS — R0600 Dyspnea, unspecified: Secondary | ICD-10-CM

## 2016-01-24 DIAGNOSIS — R0602 Shortness of breath: Secondary | ICD-10-CM | POA: Diagnosis not present

## 2016-01-24 DIAGNOSIS — O99513 Diseases of the respiratory system complicating pregnancy, third trimester: Secondary | ICD-10-CM | POA: Insufficient documentation

## 2016-01-24 DIAGNOSIS — Z7951 Long term (current) use of inhaled steroids: Secondary | ICD-10-CM

## 2016-01-24 DIAGNOSIS — R079 Chest pain, unspecified: Secondary | ICD-10-CM | POA: Diagnosis present

## 2016-01-24 DIAGNOSIS — J45909 Unspecified asthma, uncomplicated: Secondary | ICD-10-CM | POA: Diagnosis not present

## 2016-01-24 DIAGNOSIS — Z79899 Other long term (current) drug therapy: Secondary | ICD-10-CM

## 2016-01-24 DIAGNOSIS — Z3A38 38 weeks gestation of pregnancy: Secondary | ICD-10-CM | POA: Diagnosis not present

## 2016-01-24 LAB — BASIC METABOLIC PANEL
Anion gap: 8 (ref 5–15)
CALCIUM: 8.4 mg/dL — AB (ref 8.9–10.3)
CO2: 21 mmol/L — ABNORMAL LOW (ref 22–32)
CREATININE: 0.46 mg/dL (ref 0.44–1.00)
Chloride: 111 mmol/L (ref 101–111)
GFR calc Af Amer: >60 mL/min (ref >60)
GLUCOSE: 84 mg/dL (ref 65–99)
Potassium: 3.4 mmol/L — ABNORMAL LOW (ref 3.5–5.1)
SODIUM: 140 mmol/L (ref 135–145)

## 2016-01-24 LAB — CBC
HCT: 33.7 % — ABNORMAL LOW (ref 35.0–47.0)
Hemoglobin: 11.3 g/dL — ABNORMAL LOW (ref 12.0–16.0)
MCH: 30.1 pg (ref 26.0–34.0)
MCHC: 33.4 g/dL (ref 32.0–36.0)
MCV: 90.2 fL (ref 80.0–100.0)
PLATELETS: 425 10*3/uL (ref 150–440)
RBC: 3.74 MIL/uL — ABNORMAL LOW (ref 3.80–5.20)
RDW: 13.3 % (ref 11.5–14.5)
WBC: 12.8 10*3/uL — AB (ref 3.6–11.0)

## 2016-01-24 LAB — TROPONIN I

## 2016-01-24 LAB — FIBRIN DERIVATIVES D-DIMER (ARMC ONLY): FIBRIN DERIVATIVES D-DIMER (ARMC): 1252 — AB (ref 0–499)

## 2016-01-24 NOTE — ED Notes (Signed)
Sent in from [facility] with Chest tightness and SOB  Sudden onset     Pt is 36 weeks preg

## 2016-01-24 NOTE — ED Provider Notes (Signed)
[facility] Emergency Department Provider Note  Time seen: 1:21 PM  I have reviewed the triage vital signs and the nursing notes.   HISTORY  Chief Complaint Chest Pain    HPI Amber Page is a 30 y.o. female with a past medical history of asthma, [redacted] weeks pregnant who presents the emergency department for chest pain. Breathing. According to the patient pressure 30 minutes prior to arrival she had sudden onset of chest discomfort and difficulty breathing. States a history of anxiety which this felt similar to palpation. Denies any chest pain now or at any time. Denies any recent pain in either leg or abnormal swelling in either leg. Denies any history of blood clot or DVT in the past. Denies any cardiac history. At the time of examination the patient states her symptoms are mostly gone at this time. She went to labor and delivery but they sent her here for evaluation.     Past Medical History  Diagnosis Date  . Asthma     Patient Active Problem List   Diagnosis Date Noted  . Nausea and vomiting during pregnancy 01/01/2016    Past Surgical History  Procedure Laterality Date  . Cesarean section      x 2  . Breast surgery    . Hand tendon surgery Right     Current Outpatient Rx  Name  Route  Sig  Dispense  Refill  . albuterol (PROVENTIL HFA;VENTOLIN HFA) 108 (90 Base) MCG/ACT inhaler   Inhalation   Inhale 2 puffs into the lungs every 6 (six) hours as needed for wheezing or shortness of breath.         . Fluticasone-Salmeterol (ADVAIR) 250-50 MCG/DOSE AEPB   Inhalation   Inhale 1 puff into the lungs 2 (two) times daily.         . Prenatal Multivit-Min-Fe-FA (PRENATAL VITAMINS PO)   Oral   Take by mouth.         . ranitidine (ZANTAC) 75 MG tablet   Oral   Take 75 mg by mouth 2 (two) times daily.           Allergies Review of patient's allergies indicates no known allergies.  No family history on file.  Social History Social  History  Substance Use Topics  . Smoking status: Never Smoker   . Smokeless tobacco: Never Used  . Alcohol Use: No    Review of Systems Constitutional: Negative for fever. Cardiovascular: Negative for chest pain. Respiratory: Positive for shortness of breath. Largely resolved. Gastrointestinal: Negative for abdominal pain. Denies contraction type pains. Neurological: Negative for headache 10-point ROS otherwise negative.  ____________________________________________   PHYSICAL EXAM:  VITAL SIGNS: ED Triage Vitals  Enc Vitals Group     BP 01/24/16 1140 124/78 mmHg     Pulse Rate 01/24/16 1140 113     Resp 01/24/16 1140 22     Temp 01/24/16 1140 98 F (36.7 C)     Temp Source 01/24/16 1140 Oral     SpO2 01/24/16 1140 100 %     Weight 01/24/16 1140 205 lb (92.987 kg)     Height 01/24/16 1140  (1.651 m)     Head Cir --      Peak Flow --      Pain Score --      Pain Loc --      Pain Edu? --      Excl. in GC? --     Constitutional: Alert and oriented. Well  appearing and in no distress. Eyes: Normal exam ENT   Head: Normocephalic and atraumatic.   Mouth/Throat: Mucous membranes are moist. Cardiovascular: Normal rate, regular rhythm around 100 bpm. No murmur Respiratory: Normal respiratory effort without tachypnea nor retractions. Breath sounds are clear and equal bilaterally.  Gastrointestinal: Gravid abdomen, nontender. Musculoskeletal: Nontender with normal range of motion in all extremities. No lower extremity tenderness  Neurologic:  Normal speech and language. No gross focal neurologic deficits Skin:  Skin is warm, dry and intact.  Psychiatric: Mood and affect are normal. Speech and behavior are normal.  ____________________________________________    EKG  EKG reviewed and interpreted by myself shows normal sinus rhythm at 95 bpm, narrow QRS, normal axis, normal intervals, no ST changes. Overall normal  EKG.  ____________________________________________    INITIAL IMPRESSION / ASSESSMENT AND PLAN / ED COURSE  Pertinent labs & imaging results that were available during my care of the patient were reviewed by me and considered in my medical decision making (see chart for details).  Patient presents with sudden onset shortness of breath. Denies any chest pain now returning times. Denies any pleuritic pain. We will check labs, chest x-ray. I discussed with the patient the possibility of a PE, we will check a d-dimer however as the patient is pregnant this will likely result elevated. I discussed with the patient but does result elevated we will discuss further management. The patient is largely asymptomatic at this time, states the shortness of breath has resolved. I discussed obtaining a CT scan of the chest to further evaluate and rule out pulmonary emboli, patient states her symptoms are gone and she would much prefer to avoid any type of radiation exposure to the child. I discussed at length with the patient the risks and benefits of doing the scan, as well as the risks of not doing the scan. Patient understands, and states as her symptoms are gone she feels like this was anxiety and does not wish to pursue working this up any further. If symptoms return she will return immediately to the emergency department.  ____________________________________________   FINAL CLINICAL IMPRESSION(S) / ED DIAGNOSES  Dyspnea   Minna Antis, MD 01/24/16 1330

## 2016-01-24 NOTE — Discharge Summary (Signed)
Patient discharged with instructions with follow up appointments, pre operative appointments, fetal kick counts, and braxton hicks contractions. Patient ambulatoty with steady gait at discharge.

## 2016-01-24 NOTE — Discharge Instructions (Signed)
As we have discussed if you develop any further shortness breath, or chest pain at any time please return immediately to the emergency department for further evaluation. Otherwise please follow up with your OB/GYN as soon as possible to inform them of today's emergency department visit.   Shortness of Breath Shortness of breath means you have trouble breathing. Shortness of breath needs medical care right away. HOME CARE   Do not smoke.  Avoid being around chemicals or things (paint fumes, dust) that may bother your breathing.  Rest as needed. Slowly begin your normal activities.  Only take medicines as told by your doctor.  Keep all doctor visits as told. GET HELP RIGHT AWAY IF:   Your shortness of breath gets worse.  You feel lightheaded, pass out (faint), or have a cough that is not helped by medicine.  You cough up blood.  You have pain with breathing.  You have pain in your chest, arms, shoulders, or belly (abdomen).  You have a fever.  You cannot walk up stairs or exercise the way you normally do.  You do not get better in the time expected.  You have a hard time doing normal activities even with rest.  You have problems with your medicines.  You have any new symptoms. MAKE SURE YOU:  Understand these instructions.  Will watch your condition.  Will get help right away if you are not doing well or get worse.   This information is not intended to replace advice given to you by your health care provider. Make sure you discuss any questions you have with your health care provider.   Document Released: 05/22/2008 Document Revised: 12/09/2013 Document Reviewed: 02/19/2012 Elsevier Interactive Patient Education [employer]

## 2016-01-24 NOTE — ED Notes (Signed)
Patient declines CT.

## 2016-01-24 NOTE — ED Notes (Signed)
Dr P at bedside

## 2016-01-24 NOTE — Final Progress Note (Signed)
Physician Final Progress Note  Patient ID: Amber Page MRN: [id number] DOB/AGE: 31-28-1987 29 y.o.  Admit date: 01/24/2016 Admitting provider: Clifford Bing, MD Discharge date: 01/24/2016   Admission Diagnoses: IUP at 38wk4d Fetal tachycardia in office Shortness of breath/chest discomfort-resolved  Discharge Diagnoses:  Reactive non stress test  Consults: none  Significant Findings/ Diagnostic Studies: 30 year old G3 p2002 with EDC= 02/03/2016, now at 38wk4d gestation,  was seen in the office and later evaluated in the ER for sudden onset of shortness of breath and chest discomfort. Her symptoms resolved spontaneously and she was sent home from the ER after declining a CT scan to rule out pulmonary embolus. In the office her FHTs were in the 160s (after use of her asthma inhalers) and she was contacted to return to the hospital for a non stress test. Prenatal care at [facility] remarkable for asthma, prior Cesarean section x2, close interval spacing of pregnancies, obesity, and an elevated one hour GTT of 177 with a 3 hour with one abnormal value out of 4 (2hr value 163)  Procedures: Non stress test was reactive with a baseline 125-130 and accelerations to 150s to 170 with moderate variability. Having mild irregular contractions.  Discharge Condition:stable  Disposition: 01-Home or Self Care  Diet: as tolerated  Discharge Activity: as tolerated     Medication List    ASK your doctor about these medications        albuterol 108 (90 Base) MCG/ACT inhaler  Commonly known as:  PROVENTIL HFA;VENTOLIN HFA  Inhale 2 puffs into the lungs every 6 (six) hours as needed for wheezing or shortness of breath.     Fluticasone-Salmeterol 250-50 MCG/DOSE Aepb  Commonly known as:  ADVAIR  Inhale 1 puff into the lungs 2 (two) times daily.     PRENATAL VITAMINS PO  Take by mouth.     ranitidine 75 MG tablet  Commonly known as:  ZANTAC  Take 75 mg by mouth 2 (two) times daily.       Follow  up tomorrow for preop at [facility]   Total time spent taking care of this patient: 15 minutes  Signed: Farrel Conners 01/24/2016, 4:29 PM

## 2016-01-26 ENCOUNTER — Encounter
Admission: RE | Admit: 2016-01-26 | Discharge: 2016-01-26 | Disposition: A | Discharge status: Home / Self Care | Payer: BC Managed Care – PPO | Source: Ambulatory Visit | Attending: Obstetrics & Gynecology | Admitting: Obstetrics & Gynecology

## 2016-01-26 HISTORY — DX: Gastro-esophageal reflux disease without esophagitis: K21.9

## 2016-01-26 LAB — CBC
HCT: 32.1 % — ABNORMAL LOW (ref 35.0–47.0)
Hemoglobin: 10.6 g/dL — ABNORMAL LOW (ref 12.0–16.0)
MCH: 29.4 pg (ref 26.0–34.0)
MCHC: 32.9 g/dL (ref 32.0–36.0)
MCV: 89.3 fL (ref 80.0–100.0)
PLATELETS: 428 10*3/uL (ref 150–440)
RBC: 3.6 MIL/uL — AB (ref 3.80–5.20)
RDW: 13.6 % (ref 11.5–14.5)
WBC: 11.7 10*3/uL — AB (ref 3.6–11.0)

## 2016-01-26 LAB — TYPE AND SCREEN
ABO/RH(D): O POS
Antibody Screen: NEGATIVE
EXTEND SAMPLE REASON: UNDETERMINED

## 2016-01-26 LAB — ABO/RH: ABO/RH(D): O POS

## 2016-01-27 ENCOUNTER — Inpatient Hospital Stay: Payer: BC Managed Care – PPO

## 2016-01-27 ENCOUNTER — Inpatient Hospital Stay: Payer: BC Managed Care – PPO | Admitting: Anesthesiology

## 2016-01-27 ENCOUNTER — Inpatient Hospital Stay
Admission: RE | Admit: 2016-01-27 | Discharge: 2016-01-29 | DRG: 765 | Disposition: A | Discharge status: Home / Self Care | Payer: BC Managed Care – PPO | Attending: Obstetrics & Gynecology | Admitting: Obstetrics & Gynecology

## 2016-01-27 ENCOUNTER — Inpatient Hospital Stay
Admission: RE | Admit: 2016-01-27 | Payer: BC Managed Care – PPO | Source: Ambulatory Visit | Admitting: Obstetrics & Gynecology

## 2016-01-27 ENCOUNTER — Encounter
Admission: RE | Disposition: A | Discharge status: Home / Self Care | Payer: Self-pay | Source: Home / Self Care | Attending: Obstetrics & Gynecology

## 2016-01-27 ENCOUNTER — Encounter: Payer: Self-pay | Admitting: Anesthesiology

## 2016-01-27 DIAGNOSIS — O99824 Streptococcus B carrier state complicating childbirth: Secondary | ICD-10-CM | POA: Diagnosis present

## 2016-01-27 DIAGNOSIS — O99214 Obesity complicating childbirth: Secondary | ICD-10-CM | POA: Diagnosis present

## 2016-01-27 DIAGNOSIS — Z6836 Body mass index (BMI) 36.0-36.9, adult: Secondary | ICD-10-CM

## 2016-01-27 DIAGNOSIS — R0602 Shortness of breath: Secondary | ICD-10-CM

## 2016-01-27 DIAGNOSIS — D62 Acute posthemorrhagic anemia: Secondary | ICD-10-CM | POA: Diagnosis not present

## 2016-01-27 DIAGNOSIS — O34219 Maternal care for unspecified type scar from previous cesarean delivery: Principal | ICD-10-CM | POA: Diagnosis present

## 2016-01-27 DIAGNOSIS — Z3A39 39 weeks gestation of pregnancy: Secondary | ICD-10-CM

## 2016-01-27 DIAGNOSIS — J45909 Unspecified asthma, uncomplicated: Secondary | ICD-10-CM | POA: Diagnosis present

## 2016-01-27 DIAGNOSIS — E669 Obesity, unspecified: Secondary | ICD-10-CM | POA: Diagnosis present

## 2016-01-27 DIAGNOSIS — O9952 Diseases of the respiratory system complicating childbirth: Secondary | ICD-10-CM | POA: Diagnosis present

## 2016-01-27 DIAGNOSIS — O9081 Anemia of the puerperium: Secondary | ICD-10-CM | POA: Diagnosis not present

## 2016-01-27 DIAGNOSIS — O9962 Diseases of the digestive system complicating childbirth: Secondary | ICD-10-CM | POA: Diagnosis present

## 2016-01-27 DIAGNOSIS — O3663X0 Maternal care for excessive fetal growth, third trimester, not applicable or unspecified: Secondary | ICD-10-CM | POA: Diagnosis present

## 2016-01-27 DIAGNOSIS — Z98891 History of uterine scar from previous surgery: Secondary | ICD-10-CM

## 2016-01-27 HISTORY — DX: History of uterine scar from previous surgery: Z98.891

## 2016-01-27 LAB — PLATELET COUNT: Platelets: 323 10*3/uL (ref 150–440)

## 2016-01-27 LAB — POCT I-STAT 4, (NA,K, GLUC, HGB,HCT)
Glucose, Bld: 82 mg/dL (ref 65–99)
HCT: 33 % — ABNORMAL LOW (ref 36.0–46.0)
Hemoglobin: 11.2 g/dL — ABNORMAL LOW (ref 12.0–15.0)
Potassium: 3.6 mmol/L (ref 3.5–5.1)
Sodium: 139 mmol/L (ref 135–145)

## 2016-01-27 LAB — PROTIME-INR
INR: 1.17
Prothrombin Time: 15.1 s — ABNORMAL HIGH (ref 11.4–15.0)

## 2016-01-27 LAB — HEMOGLOBIN AND HEMATOCRIT, BLOOD
HCT: 32.2 % — ABNORMAL LOW (ref 35.0–47.0)
Hemoglobin: 10.6 g/dL — ABNORMAL LOW (ref 12.0–16.0)

## 2016-01-27 LAB — HIV ANTIBODY (ROUTINE TESTING W REFLEX): HIV Screen 4th Generation wRfx: NONREACTIVE

## 2016-01-27 LAB — RPR: RPR Ser Ql: NONREACTIVE

## 2016-01-27 LAB — FIBRINOGEN: Fibrinogen: 457 mg/dL (ref 210–470)

## 2016-01-27 LAB — APTT: aPTT: 28 s (ref 24–36)

## 2016-01-27 SURGERY — Surgical Case
Anesthesia: Spinal | Site: Abdomen | Wound class: Clean

## 2016-01-27 MED ORDER — OXYCODONE-ACETAMINOPHEN 5-325 MG PO TABS: 1.0000 | ORAL_TABLET | ORAL | Status: DC | PRN

## 2016-01-27 MED ORDER — NALBUPHINE HCL 10 MG/ML IJ SOLN
5.0000 mg | INTRAMUSCULAR | Status: DC | PRN
  Filled 2016-01-27: qty 0.5

## 2016-01-27 MED ORDER — FENTANYL CITRATE (PF) 100 MCG/2ML IJ SOLN
25.0000 ug | INTRAMUSCULAR | Status: DC | PRN
  Administered 2016-01-27 (×2): 25 ug via INTRAVENOUS

## 2016-01-27 MED ORDER — LACTATED RINGERS IV SOLN: INTRAVENOUS | Status: DC

## 2016-01-27 MED ORDER — WITCH HAZEL-GLYCERIN EX PADS: 1.0000 "application " | MEDICATED_PAD | CUTANEOUS | Status: DC | PRN

## 2016-01-27 MED ORDER — DIBUCAINE 1 % RE OINT: 1.0000 "application " | TOPICAL_OINTMENT | RECTAL | Status: DC | PRN

## 2016-01-27 MED ORDER — OXYTOCIN 10 UNIT/ML IJ SOLN
2.5000 [IU]/h | INTRAVENOUS | Status: DC
  Filled 2016-01-27: qty 4

## 2016-01-27 MED ORDER — BUPIVACAINE 0.25 % ON-Q PUMP DUAL CATH 400 ML
INJECTION | Status: AC
  Filled 2016-01-27: qty 400

## 2016-01-27 MED ORDER — ONDANSETRON HCL 4 MG/2ML IJ SOLN
INTRAMUSCULAR | Status: DC | PRN
  Administered 2016-01-27: 4 mg via INTRAVENOUS

## 2016-01-27 MED ORDER — BUPIVACAINE IN DEXTROSE 0.75-8.25 % IT SOLN
INTRATHECAL | Status: DC | PRN
  Administered 2016-01-27: 1.6 mL via INTRATHECAL

## 2016-01-27 MED ORDER — PRENATAL MULTIVITAMIN CH
1.0000 | ORAL_TABLET | Freq: Every day | ORAL | Status: DC
  Administered 2016-01-27 – 2016-01-29 (×3): 1 via ORAL
  Filled 2016-01-27 (×3): qty 1

## 2016-01-27 MED ORDER — BUPIVACAINE HCL (PF) 0.5 % IJ SOLN
INTRAMUSCULAR | Status: AC
  Filled 2016-01-27: qty 30

## 2016-01-27 MED ORDER — KETOROLAC TROMETHAMINE 30 MG/ML IJ SOLN
30.0000 mg | Freq: Four times a day (QID) | INTRAMUSCULAR | Status: DC | PRN
  Administered 2016-01-27: 30 mg via INTRAVENOUS
  Filled 2016-01-27 (×3): qty 1

## 2016-01-27 MED ORDER — SODIUM CHLORIDE 0.9 % IV SOLN: 250.0000 mL | INTRAVENOUS | Status: DC

## 2016-01-27 MED ORDER — LACTATED RINGERS IV SOLN
INTRAVENOUS | Status: DC
  Administered 2016-01-27: 09:00:00 via INTRAVENOUS
  Administered 2016-01-27: 999 mL/h via INTRAVENOUS

## 2016-01-27 MED ORDER — KETOROLAC TROMETHAMINE 30 MG/ML IJ SOLN
30.0000 mg | Freq: Four times a day (QID) | INTRAMUSCULAR | Status: DC | PRN
Start: 2016-01-27 — End: 2016-01-27
  Filled 2016-01-27: qty 1

## 2016-01-27 MED ORDER — ONDANSETRON HCL 4 MG/2ML IJ SOLN: 4.0000 mg | Freq: Once | INTRAMUSCULAR | Status: DC | PRN

## 2016-01-27 MED ORDER — SODIUM CHLORIDE 0.9% FLUSH: 3.0000 mL | INTRAVENOUS | Status: DC | PRN

## 2016-01-27 MED ORDER — NALBUPHINE HCL 10 MG/ML IJ SOLN
5.0000 mg | Freq: Once | INTRAMUSCULAR | Status: DC | PRN
  Filled 2016-01-27: qty 0.5

## 2016-01-27 MED ORDER — NALOXONE HCL 0.4 MG/ML IJ SOLN
0.4000 mg | INTRAMUSCULAR | Status: DC | PRN
  Filled 2016-01-27: qty 1

## 2016-01-27 MED ORDER — FENTANYL CITRATE (PF) 100 MCG/2ML IJ SOLN
INTRAMUSCULAR | Status: AC
  Filled 2016-01-27: qty 2

## 2016-01-27 MED ORDER — IBUPROFEN 600 MG PO TABS
600.0000 mg | ORAL_TABLET | Freq: Four times a day (QID) | ORAL | Status: DC
  Administered 2016-01-28 (×4): 600 mg via ORAL
  Filled 2016-01-27 (×4): qty 1

## 2016-01-27 MED ORDER — 0.9 % SODIUM CHLORIDE (POUR BTL) OPTIME
TOPICAL | Status: DC | PRN
  Administered 2016-01-27: 1000 mL

## 2016-01-27 MED ORDER — DIPHENHYDRAMINE HCL 25 MG PO CAPS: 25.0000 mg | ORAL_CAPSULE | Freq: Four times a day (QID) | ORAL | Status: DC | PRN

## 2016-01-27 MED ORDER — SCOPOLAMINE 1 MG/3DAYS TD PT72: 1.0000 | MEDICATED_PATCH | Freq: Once | TRANSDERMAL | Status: DC

## 2016-01-27 MED ORDER — CITRIC ACID-SODIUM CITRATE 334-500 MG/5ML PO SOLN
ORAL | Status: AC
  Administered 2016-01-27: 30 mL
  Filled 2016-01-27: qty 15

## 2016-01-27 MED ORDER — PHENYLEPHRINE HCL 10 MG/ML IJ SOLN
INTRAMUSCULAR | Status: DC | PRN
  Administered 2016-01-27 (×7): 100 ug via INTRAVENOUS

## 2016-01-27 MED ORDER — CEFAZOLIN SODIUM-DEXTROSE 2-3 GM-% IV SOLR
INTRAVENOUS | Status: AC
  Administered 2016-01-27: 2000 mg
  Filled 2016-01-27: qty 50

## 2016-01-27 MED ORDER — SODIUM CHLORIDE 0.9% FLUSH: 3.0000 mL | Freq: Two times a day (BID) | INTRAVENOUS | Status: DC

## 2016-01-27 MED ORDER — ACETAMINOPHEN 325 MG PO TABS: 650.0000 mg | ORAL_TABLET | ORAL | Status: DC | PRN

## 2016-01-27 MED ORDER — OXYCODONE-ACETAMINOPHEN 5-325 MG PO TABS: 2.0000 | ORAL_TABLET | ORAL | Status: DC | PRN

## 2016-01-27 MED ORDER — MORPHINE SULFATE (PF) 0.5 MG/ML IJ SOLN
INTRAMUSCULAR | Status: DC | PRN
  Administered 2016-01-27: .2 mg via EPIDURAL

## 2016-01-27 MED ORDER — MENTHOL 3 MG MT LOZG
1.0000 | LOZENGE | OROMUCOSAL | Status: DC | PRN
  Filled 2016-01-27: qty 9

## 2016-01-27 MED ORDER — ONDANSETRON HCL 4 MG/2ML IJ SOLN: 4.0000 mg | Freq: Three times a day (TID) | INTRAMUSCULAR | Status: DC | PRN

## 2016-01-27 MED ORDER — SIMETHICONE 80 MG PO CHEW
160.0000 mg | CHEWABLE_TABLET | Freq: Four times a day (QID) | ORAL | Status: DC | PRN
  Administered 2016-01-27 – 2016-01-28 (×2): 160 mg via ORAL
  Filled 2016-01-27 (×2): qty 2

## 2016-01-27 MED ORDER — BUPIVACAINE HCL 0.5 % IJ SOLN
INTRAMUSCULAR | Status: DC | PRN
  Administered 2016-01-27: 10 mL

## 2016-01-27 MED ORDER — LANOLIN HYDROUS EX OINT: 1.0000 "application " | TOPICAL_OINTMENT | CUTANEOUS | Status: DC | PRN

## 2016-01-27 MED ORDER — LACTATED RINGERS IV SOLN
INTRAVENOUS | Status: DC
  Administered 2016-01-27: 22:00:00 via INTRAVENOUS

## 2016-01-27 MED ORDER — MEPERIDINE HCL 25 MG/ML IJ SOLN: 6.2500 mg | INTRAMUSCULAR | Status: DC | PRN

## 2016-01-27 MED ORDER — BUPIVACAINE ON-Q PAIN PUMP (FOR ORDER SET NO CHG)
INJECTION | Status: DC
  Filled 2016-01-27: qty 1

## 2016-01-27 MED ORDER — NALOXONE HCL 2 MG/2ML IJ SOSY
1.0000 ug/kg/h | PREFILLED_SYRINGE | INTRAVENOUS | Status: DC | PRN
  Filled 2016-01-27: qty 2

## 2016-01-27 MED ORDER — OXYTOCIN 40 UNITS IN LACTATED RINGERS INFUSION - SIMPLE MED
INTRAVENOUS | Status: AC
  Administered 2016-01-27 (×2): 300 mL via INTRAVENOUS
  Filled 2016-01-27: qty 1000

## 2016-01-27 MED ORDER — IBUPROFEN 600 MG PO TABS
600.0000 mg | ORAL_TABLET | Freq: Four times a day (QID) | ORAL | Status: DC
  Administered 2016-01-27: 600 mg via ORAL
  Filled 2016-01-27: qty 1

## 2016-01-27 SURGICAL SUPPLY — 31 items
CANISTER SUCT 3000ML (MISCELLANEOUS) ×3 IMPLANT
CATH KIT ON-Q SILVERSOAK 5IN (CATHETERS) ×6 IMPLANT
CLOSURE WOUND 1/2 X4 (GAUZE/BANDAGES/DRESSINGS) ×1
DRSG TELFA 3X8 NADH (GAUZE/BANDAGES/DRESSINGS) ×3 IMPLANT
ELECT CAUTERY BLADE 6.4 (BLADE) ×3 IMPLANT
ELECT REM PT RETURN 9FT ADLT (ELECTROSURGICAL) ×3
ELECTRODE REM PT RTRN 9FT ADLT (ELECTROSURGICAL) ×1 IMPLANT
GAUZE SPONGE 4X4 12PLY STRL (GAUZE/BANDAGES/DRESSINGS) ×3 IMPLANT
GLOVE BIOGEL PI IND STRL 6.5 (GLOVE) ×1 IMPLANT
GLOVE BIOGEL PI INDICATOR 6.5 (GLOVE) ×2
GLOVE SURG SYN 6.5 ES PF (GLOVE) ×3 IMPLANT
GOWN STRL REUS W/ TWL LRG LVL3 (GOWN DISPOSABLE) ×5 IMPLANT
GOWN STRL REUS W/TWL LRG LVL3 (GOWN DISPOSABLE) ×10
LIQUID BAND (GAUZE/BANDAGES/DRESSINGS) ×3 IMPLANT
NS IRRIG 1000ML POUR BTL (IV SOLUTION) ×3 IMPLANT
PACK C SECTION AR (MISCELLANEOUS) ×3 IMPLANT
PAD OB MATERNITY 4.3X12.25 (PERSONAL CARE ITEMS) ×3 IMPLANT
PAD PREP 24X41 OB/GYN DISP (PERSONAL CARE ITEMS) ×3 IMPLANT
STRAP SAFETY BODY (MISCELLANEOUS) ×3 IMPLANT
STRIP CLOSURE SKIN 1/2X4 (GAUZE/BANDAGES/DRESSINGS) ×2 IMPLANT
SUT MNCRL 4-0 (SUTURE) ×2
SUT MNCRL 4-0 27XMFL (SUTURE) ×1
SUT PDS AB 1 TP1 96 (SUTURE) ×3 IMPLANT
SUT VIC AB 0 CT1 36 (SUTURE) ×6 IMPLANT
SUT VIC AB 2-0 CT1 27 (SUTURE) ×2
SUT VIC AB 2-0 CT1 TAPERPNT 27 (SUTURE) ×1 IMPLANT
SUT VIC AB 3-0 SH 27 (SUTURE) ×4
SUT VIC AB 3-0 SH 27X BRD (SUTURE) ×2 IMPLANT
SUTURE MNCRL 4-0 27XMF (SUTURE) ×1 IMPLANT
SWABSTK COMLB BENZOIN TINCTURE (MISCELLANEOUS) ×3 IMPLANT
WIPE CHLORASCRUB 1ML (MISCELLANEOUS) ×3 IMPLANT

## 2016-01-27 NOTE — Progress Notes (Signed)
Pt. to OR for repeat scheduled C-Section. Baby Boy delivered at [phone].

## 2016-01-27 NOTE — Op Note (Signed)
Cesarean Section Procedure Note  01/27/2016  Patient:  Amber Page  30 y.o. female Preoperative diagnosis:  PRIOR CSECTION,TERM PREGNANCY Postoperative diagnosis:  PRIOR CSECTION,TERM PREGNANCY  PROCEDURE:  Procedure(s): REPEAT CESAREAN SECTION (N/A) with double layer closure Surgeon:  Surgeon(s) and Role:    * Chelsea Salena Saner Ward, MD - Primary Assistant: Farrel Conners, CNM Anesthesia:  spinal I/O: Total I/O In: 1500 [I.V.:1500] Out: 150 [Urine:150] Blood: 700cc Specimens:  Cord Blood Complications: None Apparent Disposition:  VS stable to PACU  Findings:  Normal appearing uterus with dense adhesion of bladder to LUS.  Normal appearing tubes and ovaries.  Indication for procedure: 29yo [id number] @ 39.0 for second repeat cesarean.  Patient reports there was a bladder injury during second cesarean due to scarring from her first surgery.  Procedure Details   The risks, benefits, complications, treatment options, and expected outcomes were discussed with the patient. Informed consent was obtained. The patient was taken to Operating Room, identified as [name] and the procedure verified as a cesarean delivery.   After administration of anesthesia, the patient was prepped and draped in the usual sterile manner, including a vaginal prep. A surgical time out was performed, with the pediatric team present. A Pfannenstiel incision was made and carried down through the subcutaneous tissue to the fascia. Fascial incision was made and extended transversely. The fascia was separated from the underlying rectus tissue superiorly and inferiorly. The peritoneum was identified and entered. Peritoneal incision was extended longitudinally.  A low transverse uterine incision was made. Delivered from Cephalic presentation was a Live born female Birth Weight: 9 lb 9.4 oz (4350 g), APGAR: 9, 9. Delayed cord clamping was performed for 45 seconds. The umbilical cord was doubly clamped and cut, and the baby was  handed off to the awaitng pediatrician.  Cord blood was obtained for evaluation. The placenta was removed intact and appeared normal. The uterus was cleared of clots, membranes, and debris. The uterus, tubes and ovaries appeared normal. The uterine incision was closed with running locking sutures of 0 Vicryl, and then a second, imbricating stitch was placed. Hemostasis was observed. The abdominal cavity was evacuated of extraneous fluid. The uterus was returned to the abdominal cavity and again the incision was inspected for hemostasis, which was confirmed.  The paracolic gutters were cleaned. The on-q trochars were pierced through the skin and fascia.  The catheters were threaded through and tucked beneath the fascia.  The fascia was then reapproximated with running suture of vicryl. After a change of gloves, the subcutaneous tissue was irrigated and reapproximated with 3-0 vicryl. The skin was closed with 4-0 Monocryl and covered with surgical glue. The on-q pump was primed with 0.5% bupivacaine and a sterile dressing was applied.   Instrument, sponge, and needle counts were correct prior the abdominal closure and at the conclusion of the case.   I was present and performed this procedure in its entirety.  ----- Ranae Plumber, MD Attending Obstetrician and Gynecologist [facility]

## 2016-01-27 NOTE — Lactation Note (Signed)
This note was copied from a baby's chart. Lactation Consultation Note  Patient Name: Amber Page ZOXWR'U Date: 01/27/2016 Reason for consult: Initial assessment   Maternal Data Has patient been taught Hand Expression?: Yes Does the patient have breastfeeding experience prior to this delivery?: No (mom has had reduction surgery)  Mom was not able to successfully breastfeed 30yo. Spoke with mom about pumping, skin-to-skin, and supplemental feedings. Instructed mother to stay with the feeding plan that she will be doing at home in order to be less stressed. She will make a decision when transferred to M/B.   Feeding Feeding Type: Bottle Fed - Formula Nipple Type: Slow - flow Length of feed: 5 min  LATCH Score/Interventions Latch: Repeated attempts needed to sustain latch, nipple held in mouth throughout feeding, stimulation needed to elicit sucking reflex. Intervention(s): Assist with latch;Adjust position  Audible Swallowing: None Intervention(s): Skin to skin;Hand expression  Type of Nipple: Everted at rest and after stimulation  Comfort (Breast/Nipple): Soft / non-tender     Hold (Positioning): Full assist, staff holds infant at breast  LATCH Score: 5  Lactation Tools Discussed/Used Tools: Nipple Dorris Carnes (to see if colostrum xfer) Nipple shield size: 24   Consult Status Consult Status: Follow-up Date: 01/27/16 Follow-up type: In-patient    Burnadette Peter 01/27/2016, 2:26 PM

## 2016-01-27 NOTE — Discharge Instructions (Signed)

## 2016-01-27 NOTE — Progress Notes (Signed)
Pt. To PACU for recovery, report given to PACU, then to [facility] RN.  Pt. Will go to [facility] RN from PACU.

## 2016-01-27 NOTE — Discharge Summary (Signed)
Obstetrical Discharge Summary  Patient Name: Amber Page DOB: 09-Dec-1986 MRN: [id number]  Date of Admission: 01/27/2016 Date of Discharge: 01/29/2016 Primary OB: [facility]   Gestational Age at Delivery: [redacted]w[redacted]d   Antepartum complications: asthma, obesity, history of 2 prior cesarean deliveries  Admitting Diagnosis: scheduled repeat cesarean Secondary Diagnosis: Patient Active Problem List   Diagnosis Date Noted  . S/P cesarean section 01/27/2016  . Postpartum care following cesarean delivery 01/27/2016  . Asthma 01/27/2016    Augmentation: n/a Complications: shortness of breath and low BPs during cesarean, resolved Intrapartum complications/course: scheduled cesarean. Date of Delivery: 01/27/16 Delivered By: Leeroy Bock Ward Delivery Type: repeat cesarean section, low transverse incision Anesthesia: spinal Placenta: sponatneous Laceration: n/a Episiotomy: n/a Newborn Data: Live born female  Birth Weight: 9 lb 9.4 oz (4350 g) APGAR: 9, 9    Discharge Physical Exam:  BP 115/82 mmHg  Pulse 77  Temp(Src) 97.9 F (36.6 C) (Oral)  Resp 20  Ht  (1.651 m)  Wt 99.791 kg (220 lb)  BMI 36.61 kg/m2  SpO2 100%  LMP 04/25/2015  Breastfeeding? Unknown  General: NAD CV: RRR without murmur Pulm: CTABL, nl effort ABD: s/nd/nt, fundus firm and below the umbilicus Lochia: appropriate Incision: c/d/i, ON Q intact DVT Evaluation: LE non-ttp, no evidence of DVT on exam.  HEMOGLOBIN  Date Value Ref Range Status  01/28/2016 9.9* 12.0 - 16.0 g/dL Final   HGB  Date Value Ref Range Status  04/17/2014 12.6 12.0-16.0 g/dL Final   HCT  Date Value Ref Range Status  01/28/2016 30.3* 35.0 - 47.0 % Final  04/18/2014 31.2* 35.0-47.0 % Final    Post partum course: Since her Cesarean section, patient has tolerated regular diet, is passing flatus, and voiding without difficulty. She has been afebrile and lochia is appropriate. Has not been able to breastfeed due to breast  augmentation, and she is now bottle feeding baby. She has been ambulating and caring for baby with minimal assistance. On POD #2 she was deemed stable for discharge. Postpartum Procedures: none Disposition: stable, discharge to home. Baby Feeding:  formula Baby Disposition: home with mom/ Colt/ 9#9.4oz  Rh Immune globulin given: no Rubella vaccine given: no Tdap vaccine given in AP or PP setting: antepartum Flu vaccine given in AP or PP setting:   Contraception: probably BCPs  Prenatal Labs:  Blood type/Rh O+  Antibody screen neg  Rubella Immune  Varicella Immune  RPR NR  HBsAg Neg  HIV NR  GC neg  Chlamydia neg  Genetic screening negative  1 hour GTT Early 86, 28wk 170  3 hour GTT 85/179/163/74  GBS positive         Plan:  Panhia Karl was discharged to home in good condition. Follow-up appointment at [facility] with Dr Elesa Massed in 1-2 weeks for incision check   Discharge Medications:   Medication List    TAKE these medications        albuterol 108 (90 Base) MCG/ACT inhaler  Commonly known as:  PROVENTIL HFA;VENTOLIN HFA  Inhale 2 puffs into the lungs every 6 (six) hours as needed for wheezing or shortness of breath.     ferrous sulfate 325 (65 FE) MG EC tablet  Take 1 tablet (325 mg total) by mouth daily with breakfast.     Fluticasone-Salmeterol 250-50 MCG/DOSE Aepb  Commonly known as:  ADVAIR  Inhale 1 puff into the lungs 2 (two) times daily.     HYDROcodone-acetaminophen 5-325 MG tablet  Commonly known as:  NORCO/VICODIN  Take  1-2 tablets by mouth every 4 (four) hours as needed for moderate pain.     ibuprofen 600 MG tablet  Commonly known as:  ADVIL,MOTRIN  Take 1 tablet (600 mg total) by mouth every 6 (six) hours as needed for mild pain or moderate pain.     PRENATAL VITAMINS PO  Take by mouth.     ranitidine 75 MG tablet  Commonly known as:  ZANTAC  Take 75 mg by mouth 2 (two) times daily.        Follow-up  Information    Follow up with Ward, Elenora Fender, MD. Schedule an appointment as soon as possible for a visit in 1 week.   Specialty:  Obstetrics and Gynecology   Why:  1-2 weeks for post op check   Contact information:   [address] [place] [address] [phone]       Signed: Farrel Conners, CNM

## 2016-01-27 NOTE — Anesthesia Preprocedure Evaluation (Addendum)
Anesthesia Evaluation  Patient identified by MRN, date of birth, ID band Patient awake    Reviewed: Allergy & Precautions, NPO status , Patient's Chart, lab work & pertinent test results, reviewed documented beta blocker date and time   Airway Mallampati: II  TM Distance: >3 FB     Dental  (+) Chipped   Pulmonary asthma ,           Cardiovascular      Neuro/Psych    GI/Hepatic GERD  Controlled,  Endo/Other    Renal/GU      Musculoskeletal   Abdominal   Peds  Hematology   Anesthesia Other Findings   Reproductive/Obstetrics                            Anesthesia Physical Anesthesia Plan  ASA: III  Anesthesia Plan: Spinal   Post-op Pain Management:    Induction: Intravenous  Airway Management Planned: Oral ETT  Additional Equipment:   Intra-op Plan:   Post-operative Plan:   Informed Consent: I have reviewed the patients History and Physical, chart, labs and discussed the procedure including the risks, benefits and alternatives for the proposed anesthesia with the patient or authorized representative who has indicated his/her understanding and acceptance.     Plan Discussed with: CRNA  Anesthesia Plan Comments:         Anesthesia Quick Evaluation

## 2016-01-27 NOTE — Lactation Note (Signed)
This note was copied from a baby's chart. Lactation Consultation Note  Patient Name: Amber Page ZOXWR'U Date: 01/27/2016 Reason for consult: Initial assessment   Maternal Data Has patient been taught Hand Expression?: Yes Does the patient have breastfeeding experience prior to this delivery?: No (mom has had reduction surgery) Mom's nipples were detatched for a long period of time due to the scar marks on both breasts. Mom says that she got engorged and had a significant let down for a few days with second baby who is now 2yo. Mom desires to breastfeed, but understands that feeding her baby is the most important aspect. When trying to use reverse pressure/hand expression, mom's breasts have what appears to be pus (dried) that comes out. Used a nipple shield during a breastfeeding to see if baby was transferring colostrum, however, the shield was just filled with saliva.   Feeding Feeding Type: Bottle Fed - Formula Nipple Type: Slow - flow Length of feed: 5 min  LATCH Score/Interventions Latch: Repeated attempts needed to sustain latch, nipple held in mouth throughout feeding, stimulation needed to elicit sucking reflex. Intervention(s): Assist with latch;Adjust position  Audible Swallowing: None Intervention(s): Skin to skin;Hand expression  Type of Nipple: Everted at rest and after stimulation  Comfort (Breast/Nipple): Soft / non-tender   The plan is for mom to nurse for , on each breast and then have mom pump for 15 min every 3 hours for stimulation. If she is collecting expressed milk, mom is to pump until her breasts are empty. Then "[place]" is to be slow-flow nipple bottle fed 20mL of formula after the feeding at the breast.      Hold (Positioning): Full assist, staff holds infant at breast  LATCH Score: 5  Lactation Tools Discussed/Used Tools: Nipple Dorris Carnes (to see if colostrum xfer) Nipple shield size: 24   Consult Status Consult Status:  Follow-up Date: 01/27/16 Follow-up type: In-patient    Burnadette Peter 01/27/2016, 2:32 PM

## 2016-01-27 NOTE — Anesthesia Procedure Notes (Signed)
Spinal  Start time: 01/27/2016 8:52 AM End time: 01/27/2016 8:57 AM Staffing Resident/CRNA: Almeta Monas Preanesthetic Checklist Completed: patient identified, surgical consent, pre-op evaluation, timeout performed, IV checked, risks and benefits discussed and monitors and equipment checked Spinal Block Patient position: sitting Prep: ChloraPrep Patient monitoring: heart rate, cardiac monitor, continuous pulse ox and blood pressure Approach: midline Location: L3-4 Injection technique: single-shot Needle Needle type: Pencil-Tip and Whitacre  Needle gauge: 25 G Assessment Sensory level: T4

## 2016-01-27 NOTE — H&P (Signed)
OB History & Physical   History of Present Illness:  Chief Complaint: scheduled cesarean  HPI:  Amber Page is a 30 y.o. G77P3003 female at [redacted]w[redacted]d dated by LMP c/w 6wk [place] with EDC of 02/03/16.  She presents to L&D for scheduled repeat cesarean delivery  +FM, no CTX, no LOF, no VB  Pregnancy Issues: 1. History of 2 prior cesarean one for Fetal intolerance of labor, one repeat 2. Obesity 3. Asthma  Maternal Medical History:   Past Medical History  Diagnosis Date  . Asthma   . GERD (gastroesophageal reflux disease)     Past Surgical History  Procedure Laterality Date  . Cesarean section      x 2  . Breast surgery    . Hand tendon surgery Right   . Breast reduction surgery Bilateral     No Known Allergies  Prior to Admission medications   Medication Sig Start Date End Date Taking? Authorizing Provider  albuterol (PROVENTIL HFA;VENTOLIN HFA) 108 (90 Base) MCG/ACT inhaler Inhale 2 puffs into the lungs every 6 (six) hours as needed for wheezing or shortness of breath.   Yes Historical Provider, MD  Fluticasone-Salmeterol (ADVAIR) 250-50 MCG/DOSE AEPB Inhale 1 puff into the lungs 2 (two) times daily.   Yes Historical Provider, MD  Prenatal Multivit-Min-Fe-FA (PRENATAL VITAMINS PO) Take by mouth.   Yes Historical Provider, MD  ranitidine (ZANTAC) 75 MG tablet Take 75 mg by mouth 2 (two) times daily.   Yes Historical Provider, MD     Prenatal care site: [facility]  Social History: She  reports that she has never smoked. She has never used smokeless tobacco. She reports that she does not drink alcohol or use illicit drugs.  Family History: family history is not on file.   Review of Systems: Negative x 10 systems reviewed except as noted in the HPI.     Physical Exam:  Vital Signs: BP 123/69 mmHg  Pulse 103  Temp(Src) 98.6 F (37 C) (Oral)  Resp 18  Ht  (1.651 m)  Wt 99.791 kg (220 lb)  BMI 36.61 kg/m2  SpO2 100%  LMP 04/25/2015  Breastfeeding?  Unknown General: no acute distress.  HEENT: normocephalic, atraumatic Heart: regular rate & rhythm.  No murmurs/rubs/gallops Lungs: clear to auscultation bilaterally, normal respiratory effort Abdomen: soft, gravid, non-tender;  Pelvic:   External: Normal external female genitalia      Extremities: non-tender, symmetric, 2+ edema bilaterally.  DTRs: 2+  Neurologic: Alert & oriented x 3.    Results for orders placed or performed during the hospital encounter of 01/27/16 (from the past 24 hour(s))  I-STAT 4, (NA,K, GLUC, HGB,HCT)     Status: Abnormal   Collection Time: 01/27/16  9:43 AM  Result Value Ref Range   Sodium 139 135 - 145 mmol/L   Potassium 3.6 3.5 - 5.1 mmol/L   Glucose, Bld 82 65 - 99 mg/dL   HCT 56.2 (L) 13.0 - 86.5 %   Hemoglobin 11.2 (L) 12.0 - 15.0 g/dL    Pertinent Results:  Prenatal Labs: Blood type/Rh O+  Antibody screen neg  Rubella Immune  Varicella Immune  RPR NR  HBsAg Neg  HIV NR  GC neg  Chlamydia neg  Genetic screening negative  1 hour GTT Early 86, 28wk 170  3 hour GTT 85/179/163/74  GBS positive   FHT: 145 TOCO: irreg SVE:  deferred   Cephalic by leopolds  Assessment:  Amber Page is a 30 y.o. G63P3003 female at [redacted]w[redacted]d with planned  repeat cesarean.   Plan:  1. Admit to Labor & Delivery 2. CBC, T&S, NPO, IVF 3. Consents obtained. 4. Continuous efm/toco until OR 5. Routine repeat C/S planned  ----- Ranae Plumber, MD Attending Obstetrician and Gynecologist [facility]

## 2016-01-27 NOTE — Consult Note (Signed)
Neonatology Note:   Attendance at C-section:    I was asked by Dr. Elesa Massed to attend this repeat C/S at term. The mother is a G3P2 O pos, GBS pos with asthma and history of having LGA infants. ROM at delivery, fluid clear. Infant vigorous with good spontaneous cry and tone. Delayed cord clamping was done. Needed only minimal bulb suctioning. Ap 9/9. Lungs clear to ausc in DR. Infant weighs 9 pounds 9 oz (LGA); parents informed that we will perform glucose screening per unit protocol. To CN to care of Pediatrician.   Doretha Sou, MD

## 2016-01-27 NOTE — Transfer of Care (Signed)
Immediate Anesthesia Transfer of Care Note  Patient: Amber Page  Procedure(s) Performed: Procedure(s): REPEAT CESAREAN SECTION (N/A)  Patient Location: PACU  Anesthesia Type:Spinal  Level of Consciousness: awake, alert  and oriented  Airway & Oxygen Therapy: Patient Spontanous Breathing and Patient connected to face mask oxygen  Post-op Assessment: Report given to RN and Post -op Vital signs reviewed and stable  Post vital signs: Reviewed and stable  Last Vitals:  Filed Vitals:   01/27/16 0706  BP: 123/69  Pulse: 103  Temp: 37 C  Resp: 18    Complications: No apparent anesthesia complications

## 2016-01-28 LAB — CBC
HCT: 30.3 % — ABNORMAL LOW (ref 35.0–47.0)
Hemoglobin: 9.9 g/dL — ABNORMAL LOW (ref 12.0–16.0)
MCH: 29.8 pg (ref 26.0–34.0)
MCHC: 32.8 g/dL (ref 32.0–36.0)
MCV: 91 fL (ref 80.0–100.0)
PLATELETS: 289 10*3/uL (ref 150–440)
RBC: 3.33 MIL/uL — ABNORMAL LOW (ref 3.80–5.20)
RDW: 13.7 % (ref 11.5–14.5)
WBC: 15.5 10*3/uL — AB (ref 3.6–11.0)

## 2016-01-28 MED ORDER — IBUPROFEN 600 MG PO TABS
600.0000 mg | ORAL_TABLET | Freq: Four times a day (QID) | ORAL | Status: DC
  Administered 2016-01-29 (×2): 600 mg via ORAL
  Filled 2016-01-28 (×3): qty 1

## 2016-01-28 NOTE — Anesthesia Postprocedure Evaluation (Signed)
Anesthesia Post Note  Patient: Amber Page  Procedure(s) Performed: Procedure(s) (LRB): REPEAT CESAREAN SECTION (N/A)  Patient location during evaluation: Mother Baby Anesthesia Type: Spinal Level of consciousness: oriented and awake and alert Pain management: pain level controlled Vital Signs Assessment: post-procedure vital signs reviewed and stable Respiratory status: spontaneous breathing, respiratory function stable and patient connected to nasal cannula oxygen Cardiovascular status: blood pressure returned to baseline and stable Postop Assessment: no headache and no backache Anesthetic complications: no    Last Vitals:  Filed Vitals:   01/27/16 2357 01/28/16 0352  BP: 106/59 104/66  Pulse: 82 85  Temp: 36.8 C 36.7 C  Resp: 18 18    Last Pain:  Filed Vitals:   01/28/16 0357  PainSc: 0-No pain                 Starling Manns

## 2016-01-28 NOTE — Anesthesia Post-op Follow-up Note (Signed)
  Anesthesia Pain Follow-up Note  Patient: Amber Page  Day #: 1  Date of Follow-up: 01/28/2016 Time: 7:26 AM  Last Vitals:  Filed Vitals:   01/27/16 2357 01/28/16 0352  BP: 106/59 104/66  Pulse: 82 85  Temp: 36.8 C 36.7 C  Resp: 18 18    Level of Consciousness: alert  Pain: none   Side Effects:None  Catheter Site Exam:clean, dry, no drainage  Plan: D/C from anesthesia care  Starling Manns

## 2016-01-28 NOTE — Progress Notes (Signed)
Obstetric Postpartum/PostOperative Daily Progress Note Subjective:  30 y.o. [id number] status post repeat cesarean delivery.  She is ambulating, is tolerating po, is voiding spontaneously.  Her pain is well controlled on PO pain medications. Her lochia is less than menses.   Medications SCHEDULED MEDICATIONS  . ibuprofen  600 mg Oral 4 times per day  . prenatal multivitamin  1 tablet Oral Q1200  . scopolamine  1 patch Transdermal Once    MEDICATION INFUSIONS  . bupivacaine ON-Q pain pump    . lactated ringers Stopped (01/28/16 1025)    PRN MEDICATIONS  acetaminophen, witch hazel-glycerin **AND** dibucaine, diphenhydrAMINE, lanolin, menthol-cetylpyridinium, ondansetron (ZOFRAN) IV, oxyCODONE-acetaminophen, oxyCODONE-acetaminophen, simethicone    Objective:   Filed Vitals:   01/27/16 2357 01/28/16 0352 01/28/16 0748 01/28/16 1139  BP: 106/59 104/66 111/73 121/80  Pulse: 82 85 75 110  Temp: 98.3 F (36.8 C) 98.1 F (36.7 C) 98 F (36.7 C)   TempSrc: Oral Oral Oral   Resp: Height:      Weight:      SpO2: 99% 100% 98% 100%    Current Vital Signs 24h Vital Sign Ranges  T 98 F (36.7 C) Temp  Avg: 98.2 F (36.8 C)  Min: 98 F (36.7 C)  Max: 98.4 F (36.9 C)  BP 121/80 mmHg BP  Min: 104/66  Max: 121/80  HR (!) 110 Pulse  Avg: 86  Min: 75  Max: 110  RR 20 Resp  Avg: 18.4  Min: 18  Max: 20  SaO2 100 % Not Delivered SpO2  Avg: 99.4 %  Min: 98 %  Max: 100 %       24 Hour I/O Current Shift I/O  Time Ins Outs 02/09 0701 - 02/10 0700 In: 3992.9 [P.O.:120; I.V.:3872.9] Out: 3700 [Urine:3700] 02/10 0701 - 02/10 1900 In: 875.4 [P.O.:240; I.V.:635.4] Out: 550 [Urine:550]  General: NAD Pulmonary: CTAB, no increased work of breathing Abdomen: non-distended, non-tender, fundus firm at level of umbilicus,  Inc: Clean/dry/intact Extremities: no edema, no erythema, no tenderness  Labs:   Recent Labs Lab 01/24/16 1208 01/26/16 1019 01/27/16 0943 01/27/16 1043  01/28/16 0547  WBC 12.8* 11.7*  --   --  15.5*  HGB 11.3* 10.6* 11.2* 10.6* 9.9*  HCT 33.7* 32.1* 33.0* 32.2* 30.3*  PLT 425 428  --  323 289     Assessment:   29 y.o. [id number] postoperative day # 1, s/p repeat cesarean section, doing well.  Plan:  1) Acute blood loss anemia - hemodynamically stable and asymptomatic - po ferrous sulfate  2) O POS / Rubella Immune (06/13 0000)/ Varicella Immune  3) TDAP administered 12/21/2015  4) bottle /Contraception = oral contraceptives (estrogen/progesterone)  5) Discharge home postoperative day #3 if stable.   Cleston Lautner, PA-S 01/28/2016 9:34 PM    I have reviewed and edited this note as appropriate.  Conard Novak, MD, FACOG 01/28/2016 4:34 PM

## 2016-01-28 NOTE — Lactation Note (Addendum)
This note was copied from a baby's chart. Lactation Consultation Note  Patient Name: Amber Page ZOXWR'U Date: 01/28/2016     Maternal Data  Pt is formula feeding and pumping to stimulate breastmilk production, has had breast reduction in 2011 and unable to breastfeed other 2 children, offered pt my services to assist with putting baby to breast, pt will call if she wants to do this.   Pt is getting a breastpump through her insurance  Feeding Feeding Type: Bottle Fed - Formula  [facility] Score/Interventions                      Lactation Tools Discussed/Used     Consult Status      Dyann Kief 01/28/2016, 5:45 PM

## 2016-01-29 MED ORDER — HYDROCODONE-ACETAMINOPHEN 5-325 MG PO TABS: 1.0000 | ORAL_TABLET | ORAL | Status: DC | PRN

## 2016-01-29 MED ORDER — FERROUS SULFATE 325 (65 FE) MG PO TBEC: 325.0000 mg | DELAYED_RELEASE_TABLET | Freq: Every day | ORAL | Status: DC

## 2016-01-29 MED ORDER — HYDROCODONE-ACETAMINOPHEN 5-325 MG PO TABS: 2.0000 | ORAL_TABLET | ORAL | Status: DC | PRN

## 2016-01-29 MED ORDER — HYDROCODONE-ACETAMINOPHEN 5-325 MG PO TABS
ORAL_TABLET | ORAL | Status: AC
  Administered 2016-01-29: 1
  Filled 2016-01-29: qty 1

## 2016-01-29 MED ORDER — IBUPROFEN 600 MG PO TABS: 600.0000 mg | ORAL_TABLET | Freq: Four times a day (QID) | ORAL | Status: DC | PRN

## 2016-01-29 NOTE — Progress Notes (Signed)
Patient discharged home with infant. Discharge instructions, prescriptions and follow up appointment given to and reviewed with patient and significant other. Patient verbalized understanding. Escorted out via wheelchair with infant by Rondel Jumbo, NT.

## 2016-11-16 DIAGNOSIS — O99210 Obesity complicating pregnancy, unspecified trimester: Secondary | ICD-10-CM | POA: Insufficient documentation

## 2017-04-16 DIAGNOSIS — O99013 Anemia complicating pregnancy, third trimester: Secondary | ICD-10-CM | POA: Insufficient documentation

## 2017-05-08 ENCOUNTER — Observation Stay
Admission: RE | Admit: 2017-05-08 | Payer: Medicaid Other | Source: Ambulatory Visit | Admitting: Obstetrics & Gynecology

## 2017-05-08 ENCOUNTER — Encounter
Admission: RE | Admit: 2017-05-08 | Discharge: 2017-05-08 | Disposition: A | Discharge status: Home / Self Care | Payer: Medicaid Other | Source: Ambulatory Visit | Attending: Obstetrics & Gynecology | Admitting: Obstetrics & Gynecology

## 2017-05-08 ENCOUNTER — Observation Stay
Admission: RE | Admit: 2017-05-08 | Discharge: 2017-05-08 | Disposition: A | Discharge status: Home / Self Care | Payer: Medicaid Other | Source: Ambulatory Visit | Attending: Obstetrics & Gynecology | Admitting: Obstetrics & Gynecology

## 2017-05-08 DIAGNOSIS — Z79899 Other long term (current) drug therapy: Secondary | ICD-10-CM | POA: Insufficient documentation

## 2017-05-08 DIAGNOSIS — O26893 Other specified pregnancy related conditions, third trimester: Secondary | ICD-10-CM

## 2017-05-08 DIAGNOSIS — R42 Dizziness and giddiness: Secondary | ICD-10-CM

## 2017-05-08 DIAGNOSIS — Z3A39 39 weeks gestation of pregnancy: Secondary | ICD-10-CM

## 2017-05-08 DIAGNOSIS — K219 Gastro-esophageal reflux disease without esophagitis: Secondary | ICD-10-CM | POA: Insufficient documentation

## 2017-05-08 HISTORY — DX: Adverse effect of unspecified anesthetic, initial encounter: T41.45XA

## 2017-05-08 HISTORY — DX: Other complications of anesthesia, initial encounter: T88.59XA

## 2017-05-08 LAB — CBC
HEMATOCRIT: 34.1 % — AB (ref 35.0–47.0)
Hemoglobin: 11 g/dL — ABNORMAL LOW (ref 12.0–16.0)
MCH: 27.7 pg (ref 26.0–34.0)
MCHC: 32.2 g/dL (ref 32.0–36.0)
MCV: 86.1 fL (ref 80.0–100.0)
Platelets: 410 10*3/uL (ref 150–440)
RBC: 3.96 MIL/uL (ref 3.80–5.20)
RDW: 14.9 % — ABNORMAL HIGH (ref 11.5–14.5)
WBC: 10 10*3/uL (ref 3.6–11.0)

## 2017-05-08 LAB — TYPE AND SCREEN
ABO/RH(D): O POS
Antibody Screen: NEGATIVE
EXTEND SAMPLE REASON: UNDETERMINED

## 2017-05-08 MED ORDER — LACTATED RINGERS IV BOLUS (SEPSIS)
1000.0000 mL | Freq: Once | INTRAVENOUS | Status: AC
  Administered 2017-05-08: 1000 mL via INTRAVENOUS

## 2017-05-08 NOTE — Plan of Care (Signed)
Discharge instructions, both oral and written, given to patient.  She verbalized understanding of plan of care and will return in the am for her scheduled c/section.  Pt denies any pain, or discomfort at this time of discharge. Leaving dept in stable condition ambulatory.  Instructions per LVW RNC.  Ellison Carwin Matalyn Nawaz RNC

## 2017-05-08 NOTE — H&P (Signed)
OB History & Physical   History of Present Illness:  Chief Complaint:   HPI:  Amber Page is a 31 y.o. 743P3003 female at 7139.0 dated by 5wk US for unsure LMP of 07/12/16 .  She presents to L&D for scheduled CS + BTL  +FM, no CTX, no LOF, no VB  Pregnancy Issues: 1. H/o tachycardia 2. H/o asthma - controlled 3. Obesity, BMI 37 4. H/o CS x 3 5. LGA fetus  Maternal Medical History:   Past Medical History:  Diagnosis Date  . Asthma   . Complication of anesthesia    hypotension, bradycardia  . GERD (gastroesophageal reflux disease)     Past Surgical History:  Procedure Laterality Date  . BREAST REDUCTION SURGERY Bilateral   . BREAST SURGERY     reduction  . CESAREAN SECTION     x 2  . CESAREAN SECTION N/A 01/27/2016   Procedure: REPEAT CESAREAN SECTION;  Surgeon: Elenora Fenderhelsea C Tonny Isensee, MD;  Location: [facility] ORS;  Service: Obstetrics;  Laterality: N/A;  . HAND TENDON SURGERY Right     No Known Allergies  Prior to Admission medications   Medication Sig Start Date End Date Taking? Authorizing Provider  albuterol (PROVENTIL HFA;VENTOLIN HFA) 108 (90 Base) MCG/ACT inhaler Inhale 2 puffs into the lungs every 6 (six) hours as needed for wheezing or shortness of breath.   Yes [provider]  Fluticasone-Salmeterol (ADVAIR) 250-50 MCG/DOSE AEPB Inhale 1 puff into the lungs 2 (two) times daily.   Yes [provider]  Prenatal Multivit-Min-Fe-FA (PRENATAL VITAMINS PO) Take 1 tablet by mouth daily.    Yes [provider]  ranitidine (ZANTAC) 75 MG tablet Take 300 mg by mouth daily.    [provider]     [facility]    Social History: She  reports that she has never smoked. She has never used smokeless tobacco. She reports that she does not drink alcohol or use drugs.  Family History: family history includes Hypertension in her mother.   Review of Systems: A full review of systems was performed and negative except as noted  in the HPI.     Physical Exam:  General: no acute distress.  HEENT: normocephalic, atraumatic Heart: regular rate & rhythm.  No murmurs/rubs/gallops Lungs: clear to auscultation bilaterally, normal respiratory effort Abdomen: soft, gravid, non-tender;  EFW: 9.1 Pelvic:   External: Normal external female genitalia     Extremities: non-tender, symmetric,1+edema bilaterally.  DTRs:2+  Neurologic: Alert & oriented x 3.      Pertinent Results:  Prenatal Labs: Blood type/Rh O+  Antibody screen neg  Rubella Immune  Varicella Immune  RPR Reactive, TP Ab neg  HBsAg Neg  HIV NR  GC neg  Chlamydia neg  Genetic screening negative  1 hour GTT Refused, Hb A1c 5.1  3 hour GTT   GBS Not collected - refused    Cephalic by leopolds    Assessment:  Amber Page is a 31 y.o. 603P3003 female at 39.0 with repeat CS.   Plan:  1. Admit to Labor & Delivery 2. CBC, T&S, NPO, IVF, SCDs 3. GBS unknown - refused by patient 4. Consents obtained. 5. Continuous efm/toco until OR 6. To OR when ready  ----- Ranae Plumberhelsea Wendee Hata, MD Attending Obstetrician and Gynecologist [facility], Department of OB/GYN [facility]

## 2017-05-08 NOTE — Patient Instructions (Signed)
Your procedure is scheduled on: Wednesday, May 09, 2017 Report to the EMERGENCY ROOM ENTRANCE AT 5:30 AM.   REMEMBER: Instructions that are not followed completely may result in serious medical risk up to and including death; or upon the discretion of your surgeon and anesthesiologist your surgery may need to be rescheduled.  Do not eat food or drink liquids after midnight. No gum chewing or hard candies  No Alcohol for 24 hours before or after surgery.  No Smoking for 24 hours prior to surgery.  Notify your doctor if there is any change in your medical condition (cold, fever, infection).  Do not wear jewelry, make-up, hairpins, clips or nail polish.  Do not wear lotions, powders, or perfumes.   Do not shave 48 hours prior to surgery.  Do not bring valuables to the hospital. [facility] is not responsible for any belongings or valuables.   TAKE THESE MEDICATIONS THE MORNING OF SURGERY:  1.  ADVAIR INHALER   Use CHG Soap or wipes as directed on instruction sheet.  Use inhalers on the day of surgery and bring to the hospital.  Stop Anti-inflammatories such as Advil, Aleve, Ibuprofen, Motrin, Naproxen, Naprosyn, Goodie powder, or aspirin products. (May take Tylenol or Acetaminophen and Celebrex if needed.)  Stop supplements until after surgery. (May take multivitamin.)

## 2017-05-08 NOTE — Pre-Procedure Instructions (Signed)
During pre-admission testing visit, the patient stated that she did not feel well; nausea and seeing spots. Dr. Elesa MassedWard notified and ordered for the patient to go to the labor and delivery triage today. Dr. Elesa MassedWard also relayed that she spoke with the anesthesiologist (Dr. Noralyn Pickarroll) due to the patient having complications with previous surgery. Dr. Noralyn Pickarroll (anesthesia) is to evaluate the patient once in the labor/delivery unit today.

## 2017-05-08 NOTE — Discharge Instructions (Signed)
Report to hospital for scheduled C-section tomorrow. Call or come back in to be evaluated if anything changes between now and in the morning.

## 2017-05-08 NOTE — OB Triage Note (Signed)
Pt presents from pre-admit for her c-section toorrow. While down there, she was feeling nauseous, seeing spots, and felt clammy. Pt denies any pain, bleeding, or LOF. Reports positive fetal movement. Will continue to monitor

## 2017-05-09 ENCOUNTER — Inpatient Hospital Stay: Payer: Medicaid Other | Admitting: Certified Registered Nurse Anesthetist

## 2017-05-09 ENCOUNTER — Inpatient Hospital Stay
Admission: RE | Admit: 2017-05-09 | Discharge: 2017-05-11 | DRG: 766 | Disposition: A | Discharge status: Home / Self Care | Payer: Medicaid Other | Source: Ambulatory Visit | Attending: Obstetrics & Gynecology | Admitting: Obstetrics & Gynecology

## 2017-05-09 ENCOUNTER — Encounter
Admission: RE | Disposition: A | Discharge status: Home / Self Care | Payer: Self-pay | Source: Ambulatory Visit | Attending: Obstetrics & Gynecology

## 2017-05-09 ENCOUNTER — Encounter: Payer: Self-pay | Admitting: *Deleted

## 2017-05-09 DIAGNOSIS — O3663X0 Maternal care for excessive fetal growth, third trimester, not applicable or unspecified: Secondary | ICD-10-CM | POA: Diagnosis present

## 2017-05-09 DIAGNOSIS — J45909 Unspecified asthma, uncomplicated: Secondary | ICD-10-CM | POA: Diagnosis present

## 2017-05-09 DIAGNOSIS — O9962 Diseases of the digestive system complicating childbirth: Secondary | ICD-10-CM | POA: Diagnosis present

## 2017-05-09 DIAGNOSIS — O9902 Anemia complicating childbirth: Secondary | ICD-10-CM | POA: Diagnosis present

## 2017-05-09 DIAGNOSIS — E669 Obesity, unspecified: Secondary | ICD-10-CM | POA: Diagnosis present

## 2017-05-09 DIAGNOSIS — D649 Anemia, unspecified: Secondary | ICD-10-CM | POA: Diagnosis present

## 2017-05-09 DIAGNOSIS — Z302 Encounter for sterilization: Secondary | ICD-10-CM | POA: Diagnosis not present

## 2017-05-09 DIAGNOSIS — O9952 Diseases of the respiratory system complicating childbirth: Secondary | ICD-10-CM | POA: Diagnosis present

## 2017-05-09 DIAGNOSIS — O99214 Obesity complicating childbirth: Secondary | ICD-10-CM | POA: Diagnosis present

## 2017-05-09 DIAGNOSIS — Z3A39 39 weeks gestation of pregnancy: Secondary | ICD-10-CM | POA: Diagnosis not present

## 2017-05-09 DIAGNOSIS — O34211 Maternal care for low transverse scar from previous cesarean delivery: Secondary | ICD-10-CM | POA: Diagnosis present

## 2017-05-09 DIAGNOSIS — Z6837 Body mass index (BMI) 37.0-37.9, adult: Secondary | ICD-10-CM | POA: Diagnosis not present

## 2017-05-09 DIAGNOSIS — K219 Gastro-esophageal reflux disease without esophagitis: Secondary | ICD-10-CM | POA: Diagnosis present

## 2017-05-09 LAB — RPR: RPR: NONREACTIVE

## 2017-05-09 SURGERY — Surgical Case
Anesthesia: Spinal | Laterality: Bilateral | Wound class: Clean Contaminated

## 2017-05-09 MED ORDER — PROPOFOL 10 MG/ML IV BOLUS
INTRAVENOUS | Status: AC
  Filled 2017-05-09: qty 20

## 2017-05-09 MED ORDER — LACTATED RINGERS IV SOLN: INTRAVENOUS | Status: DC

## 2017-05-09 MED ORDER — ACETAMINOPHEN 500 MG PO TABS: 1000.0000 mg | ORAL_TABLET | Freq: Four times a day (QID) | ORAL | Status: DC | PRN

## 2017-05-09 MED ORDER — LACTATED RINGERS IV SOLN
Freq: Once | INTRAVENOUS | Status: AC
  Administered 2017-05-09: 07:00:00 via INTRAVENOUS

## 2017-05-09 MED ORDER — LACTATED RINGERS IV SOLN
INTRAVENOUS | Status: DC
  Administered 2017-05-09 (×2): via INTRAVENOUS

## 2017-05-09 MED ORDER — MEPERIDINE HCL 25 MG/ML IJ SOLN: 6.2500 mg | INTRAMUSCULAR | Status: DC | PRN

## 2017-05-09 MED ORDER — SODIUM CHLORIDE 0.9% FLUSH: 3.0000 mL | INTRAVENOUS | Status: DC | PRN

## 2017-05-09 MED ORDER — DIPHENHYDRAMINE HCL 50 MG/ML IJ SOLN: 12.5000 mg | INTRAMUSCULAR | Status: DC | PRN

## 2017-05-09 MED ORDER — FENTANYL CITRATE (PF) 100 MCG/2ML IJ SOLN
INTRAMUSCULAR | Status: DC | PRN
  Administered 2017-05-09 (×5): 50 ug via INTRAVENOUS
  Administered 2017-05-09: 25 ug via INTRAVENOUS

## 2017-05-09 MED ORDER — NALOXONE HCL 0.4 MG/ML IJ SOLN: 0.4000 mg | INTRAMUSCULAR | Status: DC | PRN

## 2017-05-09 MED ORDER — NALBUPHINE HCL 10 MG/ML IJ SOLN: 5.0000 mg | INTRAMUSCULAR | Status: DC | PRN

## 2017-05-09 MED ORDER — CEFAZOLIN SODIUM-DEXTROSE 2-4 GM/100ML-% IV SOLN
2.0000 g | INTRAVENOUS | Status: AC
  Administered 2017-05-09 (×2): 2 g via INTRAVENOUS
  Filled 2017-05-09 (×2): qty 100

## 2017-05-09 MED ORDER — DIBUCAINE 1 % RE OINT: 1.0000 "application " | TOPICAL_OINTMENT | RECTAL | Status: DC | PRN

## 2017-05-09 MED ORDER — ACETAMINOPHEN 650 MG RE SUPP
650.0000 mg | Freq: Once | RECTAL | Status: DC
  Filled 2017-05-09: qty 1

## 2017-05-09 MED ORDER — BUPIVACAINE HCL (PF) 0.5 % IJ SOLN
30.0000 mL | Freq: Once | INTRAMUSCULAR | Status: DC
  Filled 2017-05-09: qty 30

## 2017-05-09 MED ORDER — FENTANYL CITRATE (PF) 100 MCG/2ML IJ SOLN
INTRAMUSCULAR | Status: AC
  Filled 2017-05-09: qty 2

## 2017-05-09 MED ORDER — PHENYLEPHRINE HCL 10 MG/ML IJ SOLN
INTRAMUSCULAR | Status: DC | PRN
  Administered 2017-05-09 (×4): 50 ug via INTRAVENOUS
  Administered 2017-05-09: 100 ug via INTRAVENOUS
  Administered 2017-05-09 (×3): 50 ug via INTRAVENOUS
  Administered 2017-05-09: 100 ug via INTRAVENOUS

## 2017-05-09 MED ORDER — SCOPOLAMINE 1 MG/3DAYS TD PT72
1.0000 | MEDICATED_PATCH | Freq: Once | TRANSDERMAL | Status: DC
  Administered 2017-05-09: 1.5 mg via TRANSDERMAL
  Filled 2017-05-09: qty 1

## 2017-05-09 MED ORDER — FENTANYL CITRATE (PF) 100 MCG/2ML IJ SOLN: 25.0000 ug | INTRAMUSCULAR | Status: DC | PRN

## 2017-05-09 MED ORDER — DIPHENHYDRAMINE HCL 25 MG PO CAPS: 25.0000 mg | ORAL_CAPSULE | ORAL | Status: DC | PRN

## 2017-05-09 MED ORDER — ONDANSETRON HCL 4 MG/2ML IJ SOLN
4.0000 mg | Freq: Once | INTRAMUSCULAR | Status: AC | PRN
  Administered 2017-05-09: 4 mg via INTRAVENOUS
  Filled 2017-05-09: qty 2

## 2017-05-09 MED ORDER — IBUPROFEN 600 MG PO TABS
600.0000 mg | ORAL_TABLET | Freq: Four times a day (QID) | ORAL | Status: DC | PRN
  Filled 2017-05-09 (×3): qty 1

## 2017-05-09 MED ORDER — BUPIVACAINE IN DEXTROSE 0.75-8.25 % IT SOLN
INTRATHECAL | Status: DC | PRN
  Administered 2017-05-09: 1.6 mL via INTRATHECAL

## 2017-05-09 MED ORDER — ACETAMINOPHEN 500 MG PO TABS: 1000.0000 mg | ORAL_TABLET | Freq: Four times a day (QID) | ORAL | Status: DC

## 2017-05-09 MED ORDER — PRENATAL MULTIVITAMIN CH
1.0000 | ORAL_TABLET | Freq: Every day | ORAL | Status: DC
  Administered 2017-05-10 – 2017-05-11 (×2): 1 via ORAL
  Filled 2017-05-09 (×2): qty 1

## 2017-05-09 MED ORDER — SIMETHICONE 80 MG PO CHEW
160.0000 mg | CHEWABLE_TABLET | Freq: Four times a day (QID) | ORAL | Status: DC | PRN
  Administered 2017-05-09 – 2017-05-10 (×3): 160 mg via ORAL
  Filled 2017-05-09 (×2): qty 2

## 2017-05-09 MED ORDER — EPHEDRINE SULFATE 50 MG/ML IJ SOLN
INTRAMUSCULAR | Status: DC | PRN
  Administered 2017-05-09 (×4): 5 mg via INTRAVENOUS

## 2017-05-09 MED ORDER — DEXAMETHASONE SODIUM PHOSPHATE 10 MG/ML IJ SOLN
INTRAMUSCULAR | Status: DC | PRN
  Administered 2017-05-09: 10 mg via INTRAVENOUS

## 2017-05-09 MED ORDER — HYDROCODONE-ACETAMINOPHEN 5-325 MG PO TABS
2.0000 | ORAL_TABLET | ORAL | Status: DC | PRN
  Administered 2017-05-10 – 2017-05-11 (×4): 2 via ORAL
  Filled 2017-05-09 (×4): qty 2

## 2017-05-09 MED ORDER — OXYTOCIN 40 UNITS IN LACTATED RINGERS INFUSION - SIMPLE MED
INTRAVENOUS | Status: DC | PRN
  Administered 2017-05-09: 200 mL via INTRAVENOUS
  Administered 2017-05-09: 1000 mL via INTRAVENOUS

## 2017-05-09 MED ORDER — WITCH HAZEL-GLYCERIN EX PADS: 1.0000 "application " | MEDICATED_PAD | CUTANEOUS | Status: DC | PRN

## 2017-05-09 MED ORDER — SENNOSIDES-DOCUSATE SODIUM 8.6-50 MG PO TABS
2.0000 | ORAL_TABLET | ORAL | Status: DC
  Administered 2017-05-09 – 2017-05-11 (×2): 2 via ORAL
  Filled 2017-05-09 (×2): qty 2

## 2017-05-09 MED ORDER — NALBUPHINE HCL 10 MG/ML IJ SOLN: 5.0000 mg | Freq: Once | INTRAMUSCULAR | Status: DC | PRN

## 2017-05-09 MED ORDER — BUPIVACAINE LIPOSOME 1.3 % IJ SUSP
20.0000 mL | Freq: Once | INTRAMUSCULAR | Status: DC
Start: 2017-05-09 — End: 2017-05-11
  Filled 2017-05-09: qty 20

## 2017-05-09 MED ORDER — OXYTOCIN 40 UNITS IN LACTATED RINGERS INFUSION - SIMPLE MED
2.5000 [IU]/h | INTRAVENOUS | Status: DC
  Filled 2017-05-09: qty 1000

## 2017-05-09 MED ORDER — MORPHINE SULFATE (PF) 0.5 MG/ML IJ SOLN
INTRAMUSCULAR | Status: DC | PRN
  Administered 2017-05-09: .1 mg via EPIDURAL

## 2017-05-09 MED ORDER — ONDANSETRON HCL 4 MG/2ML IJ SOLN: 4.0000 mg | Freq: Three times a day (TID) | INTRAMUSCULAR | Status: DC | PRN

## 2017-05-09 MED ORDER — OXYCODONE HCL 5 MG PO TABS: 10.0000 mg | ORAL_TABLET | ORAL | Status: DC | PRN

## 2017-05-09 MED ORDER — SODIUM CHLORIDE 0.9 % IV SOLN: 250.0000 mL | INTRAVENOUS | Status: DC

## 2017-05-09 MED ORDER — SODIUM CHLORIDE 0.9% FLUSH: 3.0000 mL | Freq: Two times a day (BID) | INTRAVENOUS | Status: DC

## 2017-05-09 MED ORDER — SOD CITRATE-CITRIC ACID 500-334 MG/5ML PO SOLN
30.0000 mL | ORAL | Status: AC
  Administered 2017-05-09: 30 mL via ORAL
  Filled 2017-05-09: qty 15

## 2017-05-09 MED ORDER — OXYCODONE HCL 5 MG PO TABS: 5.0000 mg | ORAL_TABLET | ORAL | Status: DC | PRN

## 2017-05-09 MED ORDER — MENTHOL 3 MG MT LOZG
1.0000 | LOZENGE | OROMUCOSAL | Status: DC | PRN
  Filled 2017-05-09: qty 9

## 2017-05-09 MED ORDER — IBUPROFEN 600 MG PO TABS
600.0000 mg | ORAL_TABLET | Freq: Four times a day (QID) | ORAL | Status: DC
Start: 2017-05-09 — End: 2017-05-11
  Administered 2017-05-09 – 2017-05-11 (×7): 600 mg via ORAL
  Filled 2017-05-09 (×4): qty 1

## 2017-05-09 MED ORDER — DIPHENHYDRAMINE HCL 25 MG PO CAPS: 25.0000 mg | ORAL_CAPSULE | Freq: Four times a day (QID) | ORAL | Status: DC | PRN

## 2017-05-09 MED ORDER — SODIUM CHLORIDE 0.9 % IJ SOLN
INTRAMUSCULAR | Status: AC
  Filled 2017-05-09: qty 50

## 2017-05-09 MED ORDER — BUPIVACAINE HCL 0.5 % IJ SOLN
INTRAMUSCULAR | Status: DC | PRN
  Administered 2017-05-09: 30 mL

## 2017-05-09 MED ORDER — ONDANSETRON HCL 4 MG/2ML IJ SOLN
INTRAMUSCULAR | Status: DC | PRN
  Administered 2017-05-09: 4 mg via INTRAVENOUS

## 2017-05-09 MED ORDER — NALOXONE HCL 2 MG/2ML IJ SOSY
1.0000 ug/kg/h | PREFILLED_SYRINGE | INTRAVENOUS | Status: DC | PRN
  Filled 2017-05-09: qty 2

## 2017-05-09 MED ORDER — SODIUM CHLORIDE 0.9 % IV SOLN
INTRAVENOUS | Status: DC | PRN
  Administered 2017-05-09: 60 mL

## 2017-05-09 MED ORDER — MORPHINE SULFATE (PF) 0.5 MG/ML IJ SOLN
INTRAMUSCULAR | Status: AC
  Filled 2017-05-09: qty 10

## 2017-05-09 MED ORDER — LIDOCAINE HCL 2 % IJ SOLN
INTRAMUSCULAR | Status: AC
Start: 2017-05-09 — End: 2017-05-09
  Filled 2017-05-09: qty 10

## 2017-05-09 MED ORDER — HYDROCODONE-ACETAMINOPHEN 5-325 MG PO TABS: 1.0000 | ORAL_TABLET | ORAL | Status: DC | PRN

## 2017-05-09 MED ORDER — COCONUT OIL OIL: 1.0000 "application " | TOPICAL_OIL | Status: DC | PRN

## 2017-05-09 SURGICAL SUPPLY — 34 items
BARRIER ADHS 3X4 INTERCEED (GAUZE/BANDAGES/DRESSINGS) ×6 IMPLANT
CANISTER SUCT 3000ML PPV (MISCELLANEOUS) ×3 IMPLANT
CATH KIT ON-Q SILVERSOAK 5IN (CATHETERS) IMPLANT
CLOSURE WOUND 1/2 X4 (GAUZE/BANDAGES/DRESSINGS) ×1
DERMABOND ADVANCED (GAUZE/BANDAGES/DRESSINGS) ×2
DERMABOND ADVANCED .7 DNX12 (GAUZE/BANDAGES/DRESSINGS) ×1 IMPLANT
DRSG TELFA 3X8 NADH (GAUZE/BANDAGES/DRESSINGS) ×3 IMPLANT
ELECT CAUTERY BLADE 6.4 (BLADE) ×3 IMPLANT
ELECT REM PT RETURN 9FT ADLT (ELECTROSURGICAL) ×3
ELECTRODE REM PT RTRN 9FT ADLT (ELECTROSURGICAL) ×1 IMPLANT
GAUZE SPONGE 4X4 12PLY STRL (GAUZE/BANDAGES/DRESSINGS) ×3 IMPLANT
GLOVE PI ORTHOPRO 6.5 (GLOVE) ×2
GLOVE PI ORTHOPRO STRL 6.5 (GLOVE) ×1 IMPLANT
GLOVE SURG SYN 6.5 ES PF (GLOVE) ×3 IMPLANT
GOWN STRL REUS W/ TWL LRG LVL3 (GOWN DISPOSABLE) ×3 IMPLANT
GOWN STRL REUS W/TWL LRG LVL3 (GOWN DISPOSABLE) ×6
NS IRRIG 1000ML POUR BTL (IV SOLUTION) ×3 IMPLANT
PACK C SECTION AR (MISCELLANEOUS) ×3 IMPLANT
PAD OB MATERNITY 4.3X12.25 (PERSONAL CARE ITEMS) ×3 IMPLANT
PAD PREP 24X41 OB/GYN DISP (PERSONAL CARE ITEMS) ×3 IMPLANT
STRAP SAFETY BODY (MISCELLANEOUS) ×3 IMPLANT
STRIP CLOSURE SKIN 1/2X4 (GAUZE/BANDAGES/DRESSINGS) ×2 IMPLANT
SUT MNCRL 4-0 (SUTURE) ×2
SUT MNCRL 4-0 27XMFL (SUTURE) ×1
SUT PDS AB 1 TP1 96 (SUTURE) ×3 IMPLANT
SUT VIC AB 0 CT1 36 (SUTURE) ×6 IMPLANT
SUT VIC AB 0 CTX 36 (SUTURE) ×4
SUT VIC AB 0 CTX36XBRD ANBCTRL (SUTURE) ×2 IMPLANT
SUT VIC AB 2-0 CT1 27 (SUTURE) ×2
SUT VIC AB 2-0 CT1 TAPERPNT 27 (SUTURE) ×1 IMPLANT
SUT VIC AB 3-0 SH 27 (SUTURE) ×2
SUT VIC AB 3-0 SH 27X BRD (SUTURE) ×1 IMPLANT
SUTURE MNCRL 4-0 27XMF (SUTURE) ×1 IMPLANT
SWABSTK COMLB BENZOIN TINCTURE (MISCELLANEOUS) ×3 IMPLANT

## 2017-05-09 NOTE — Transfer of Care (Signed)
Immediate Anesthesia Transfer of Care Note  Patient: Amber MinksBrianne Grieco  Procedure(s) Performed: Procedure(s): CESAREAN SECTION WITH BILATERAL TUBAL LIGATION (Bilateral)  Patient Location: PACU  Anesthesia Type:Spinal  Level of Consciousness: awake, alert  and oriented  Airway & Oxygen Therapy: Patient Spontanous Breathing  Post-op Assessment: Report given to RN and Post -op Vital signs reviewed and stable  Post vital signs: Reviewed and stable  Last Vitals:  Vitals:   05/09/17 0605 05/09/17 0934  BP: 123/86 (!) 142/75  Pulse: (!) 107 77  Resp: 16 14  Temp: 36.6 C 36.2 C    Last Pain:  Vitals:   05/09/17 0605  TempSrc: Oral         Complications: No apparent anesthesia complications

## 2017-05-09 NOTE — Anesthesia Post-op Follow-up Note (Cosign Needed)
Anesthesia QCDR form completed.        

## 2017-05-09 NOTE — H&P (Signed)
See discharge summary

## 2017-05-09 NOTE — Anesthesia Preprocedure Evaluation (Signed)
Anesthesia Evaluation  Patient identified by MRN, date of birth, ID band Patient awake    Reviewed: Allergy & Precautions, NPO status , Patient's Chart, lab work & pertinent test results  History of Anesthesia Complications (+) history of anesthetic complications (decreased HR and BP with spinal for last C-Section)  Airway Mallampati: II       Dental   Pulmonary asthma ,           Cardiovascular negative cardio ROS       Neuro/Psych    GI/Hepatic Neg liver ROS, GERD  Medicated and Poorly Controlled,  Endo/Other  negative endocrine ROS  Renal/GU negative Renal ROS     Musculoskeletal   Abdominal   Peds  Hematology negative hematology ROS (+)   Anesthesia Other Findings   Reproductive/Obstetrics                            Anesthesia Physical Anesthesia Plan  ASA: II  Anesthesia Plan: Spinal   Post-op Pain Management:    Induction:   Airway Management Planned:   Additional Equipment:   Intra-op Plan:   Post-operative Plan:   Informed Consent: I have reviewed the patients History and Physical, chart, labs and discussed the procedure including the risks, benefits and alternatives for the proposed anesthesia with the patient or authorized representative who has indicated his/her understanding and acceptance.     Plan Discussed with:   Anesthesia Plan Comments:         Anesthesia Quick Evaluation

## 2017-05-09 NOTE — Anesthesia Procedure Notes (Signed)
Spinal  Patient location during procedure: OR Start time: 05/09/2017 7:45 AM End time: 05/09/2017 7:48 AM Staffing Anesthesiologist: Gunnar Fusi Resident/CRNA: Demetrius Charity Performed: resident/CRNA  Preanesthetic Checklist Completed: patient identified, site marked, surgical consent, pre-op evaluation, timeout performed, IV checked, risks and benefits discussed and monitors and equipment checked Spinal Block Patient position: sitting Prep: Betadine Patient monitoring: heart rate, continuous pulse ox, blood pressure and cardiac monitor Approach: midline Location: L2-3 Injection technique: single-shot Needle Needle type: Whitacre and Introducer  Needle gauge: 25 G Needle length: 9 cm Assessment Sensory level: T4 Additional Notes Negative paresthesia. Negative blood return. Positive free-flowing CSF. Expiration date of kit checked and confirmed. Patient tolerated procedure well, without complications.

## 2017-05-09 NOTE — Discharge Summary (Signed)
Amber Page is a 31 y.o. female. She is at [redacted]w[redacted]d gestation. No LMP recorded (lmp unknown). Patient is pregnant. Estimated Date of Delivery: 05/16/17  Prenatal care site: [facility]    Chief complaint: feeling lightheaded, seeing spots  Location: head/eyes Onset/timing: acute Duration: 4 hours Quality: lightheadedness Severity: mild Aggravating or alleviating conditions: nothing Associated signs/symptoms:  no CTX, no VB.no LOF,  Active fetal movement.  Context:  Patient was in pre-admit testing for scheduled CS tomorrow and complained of feeling lightheaded and seeing spots.  Was sent to triage for evaluation.  No activities out of the ordinary for her today, says she is hydrating.  She is a little nervous about her surgery, waiting to meet with anesthesia today.  S: Resting comfortably. no CTX, no VB.no LOF,  Active fetal movement.   Maternal Medical History:   Past Medical History:  Diagnosis Date  . Asthma   . Complication of anesthesia    hypotension, bradycardia  . GERD (gastroesophageal reflux disease)     Past Surgical History:  Procedure Laterality Date  . BREAST REDUCTION SURGERY Bilateral   . BREAST SURGERY     reduction  . CESAREAN SECTION     x 2  . CESAREAN SECTION N/A 01/27/2016   Procedure: REPEAT CESAREAN SECTION;  Surgeon: Elenora Fender Delani Kohli, MD;  Location: [facility] ORS;  Service: Obstetrics;  Laterality: N/A;  . HAND TENDON SURGERY Right     No Known Allergies  Prior to Admission medications   Medication Sig Start Date End Date Taking? Authorizing Provider  albuterol (PROVENTIL HFA;VENTOLIN HFA) 108 (90 Base) MCG/ACT inhaler Inhale 2 puffs into the lungs every 6 (six) hours as needed for wheezing or shortness of breath.   Yes [provider]  Fluticasone-Salmeterol (ADVAIR) 250-50 MCG/DOSE AEPB Inhale 1 puff into the lungs 2 (two) times daily.   Yes [provider]  Prenatal Multivit-Min-Fe-FA (PRENATAL VITAMINS PO) Take 1 tablet by  mouth daily.    Yes [provider]  ranitidine (ZANTAC) 75 MG tablet Take 300 mg by mouth daily.   Yes [provider]     Social History: She  reports that she has never smoked. She has never used smokeless tobacco. She reports that she does not drink alcohol or use drugs.  Family History: family history includes Hypertension in her mother.    Review of Systems: A full review of systems was performed and negative except as noted in the HPI.     O:  BP 107/68 (BP Location: Right Arm)   Pulse 87   Temp 98.3 F (36.8 C) (Oral)   Resp 16   Ht 5\' 5"  (1.651 m)   Wt 103.9 kg (229 lb)   LMP  (LMP Unknown)   SpO2 100%   BMI 38.11 kg/m  Results for orders placed or performed during the hospital encounter of 05/08/17 (from the past 48 hour(s))  CBC   Collection Time: 05/08/17 12:46 PM  Result Value Ref Range   WBC 10.0 3.6 - 11.0 K/uL   RBC 3.96 3.80 - 5.20 MIL/uL   Hemoglobin 11.0 (L) 12.0 - 16.0 g/dL   HCT 16.1 (L) 09.6 - 04.5 %   MCV 86.1 80.0 - 100.0 fL   MCH 27.7 26.0 - 34.0 pg   MCHC 32.2 32.0 - 36.0 g/dL   RDW 40.9 (H) 81.1 - 91.4 %   Platelets 410 150 - 440 K/uL  Type and screen [facility]   Collection Time: 05/08/17 12:46  PM  Result Value Ref Range   ABO/RH(D) O POS    Antibody Screen NEG    Sample Expiration 05/11/2017    Extend sample reason PREGNANT WITHIN 3 MONTHS, UNABLE TO EXTEND      Constitutional: NAD, AAOx3  HE/ENT: extraocular movements grossly intact, moist mucous membranes CV: RRR PULM: nl respiratory effort, CTABL     Abd: gravid, non-tender, non-distended, soft      Ext: Non-tender, Nonedmeatous   Psych: mood appropriate, speech normal Pelvic deferred  NST:  Baseline: 130 Variability: moderate Accelerations present x >2 Decelerations absent Time 20mins    A/P: 31 y.o. 2735w0d here for resolved lightheadedness after 1L fluid bolus.  Labor: not present.   Fetal Wellbeing: Reassuring Cat 1  tracing.  Reactive NST   D/c home stable, precautions reviewed, follow-up tomorrow for CS and BTL.   ----- Ranae Plumberhelsea Daielle Melcher, MD Attending Obstetrician and Gynecologist [facility], Department of OB/GYN [facility]

## 2017-05-10 ENCOUNTER — Other Ambulatory Visit: Payer: BC Managed Care – PPO

## 2017-05-10 ENCOUNTER — Encounter: Payer: Self-pay | Admitting: Obstetrics & Gynecology

## 2017-05-10 LAB — CBC
HCT: 26.9 % — ABNORMAL LOW (ref 35.0–47.0)
HEMOGLOBIN: 8.9 g/dL — AB (ref 12.0–16.0)
MCH: 28.5 pg (ref 26.0–34.0)
MCHC: 33.1 g/dL (ref 32.0–36.0)
MCV: 86.1 fL (ref 80.0–100.0)
PLATELETS: 337 10*3/uL (ref 150–440)
RBC: 3.13 MIL/uL — ABNORMAL LOW (ref 3.80–5.20)
RDW: 15.1 % — AB (ref 11.5–14.5)
WBC: 14.1 10*3/uL — ABNORMAL HIGH (ref 3.6–11.0)

## 2017-05-10 LAB — SURGICAL PATHOLOGY

## 2017-05-10 MED ORDER — FLEET ENEMA 7-19 GM/118ML RE ENEM
1.0000 | ENEMA | Freq: Once | RECTAL | Status: AC
  Administered 2017-05-10: 1 via RECTAL

## 2017-05-10 MED ORDER — BISACODYL 10 MG RE SUPP
10.0000 mg | Freq: Every day | RECTAL | Status: DC | PRN
  Administered 2017-05-10: 10 mg via RECTAL

## 2017-05-10 MED ORDER — CYCLOBENZAPRINE HCL 10 MG PO TABS
10.0000 mg | ORAL_TABLET | Freq: Three times a day (TID) | ORAL | Status: DC
  Administered 2017-05-10: 10 mg via ORAL
  Filled 2017-05-10 (×2): qty 1

## 2017-05-10 NOTE — Anesthesia Postprocedure Evaluation (Signed)
Anesthesia Post Note  Patient: Lucienne MinksBrianne Lorenzi  Procedure(s) Performed: Procedure(s) (LRB): CESAREAN SECTION WITH BILATERAL TUBAL LIGATION (Bilateral)  Patient location during evaluation: Mother Baby Anesthesia Type: Spinal Level of consciousness: awake and alert and oriented Pain management: pain level controlled Vital Signs Assessment: post-procedure vital signs reviewed and stable Respiratory status: respiratory function stable Cardiovascular status: blood pressure returned to baseline Postop Assessment: no headache, no backache, spinal receding, patient able to bend at knees, no signs of nausea or vomiting and adequate PO intake Anesthetic complications: no     Last Vitals:  Vitals:   05/09/17 2335 05/10/17 0307  BP: (!) 100/58 115/74  Pulse: 63 77  Resp: 16 18  Temp: 36.6 C 36.7 C    Last Pain:  Vitals:   05/10/17 0525  TempSrc:   PainSc: 8                  Clydene PughBeane, Jawann Urbani D

## 2017-05-10 NOTE — Progress Notes (Signed)
Pt complaining of pain in shoulder and radiating to neck and down arm. Discussed this pain with Milon Scorearon Jones CNM who came to bedside to assess patient and ordered EKG and Fleet Enema.

## 2017-05-10 NOTE — Anesthesia Post-op Follow-up Note (Signed)
  Anesthesia Pain Follow-up Note  Patient: Amber Page  Day #: 1  Date of Follow-up: 05/10/2017 Time: 7:20 AM  Last Vitals:  Vitals:   05/09/17 2335 05/10/17 0307  BP: (!) 100/58 115/74  Pulse: 63 77  Resp: 16 18  Temp: 36.6 C 36.7 C    Level of Consciousness: alert  Pain: mild   Side Effects:None  Catheter Site Exam:clean, dry     Plan: D/C from anesthesia care at surgeon's request  Clydene PughBeane, Heather Mckendree D

## 2017-05-10 NOTE — Progress Notes (Signed)
Called by RN due to pt c/o "severe gas pains and now radiation to the chest and now Rt arm is hurting and tingling". Pt has been unable to lie down due to severe gas pains. Has tried all options and declined enema earlier but, is willing to take the enema now.

## 2017-05-10 NOTE — Op Note (Addendum)
Cesarean Section Procedure Note  05/09/2017  Patient:  Amber Page  31 y.o. female at [redacted]w[redacted]d.  LMP 07/12/16 inconsistent with dating, dated by 5wk [place] with EDC of 05/16/17  Preoperative diagnosis:  Prior cesarean  sterilization Postoperative diagnosis:  Prior cesarean  sterilization  PROCEDURE:  Procedure(s): CESAREAN SECTION WITH BILATERAL TUBAL LIGATION (Bilateral) BILATERAL TUBAL LIGATION Surgeon:  Surgeon(s) and Role:    * Ward, Elenora Fender, MD - Primary Anesthesia:  spinal I/O: 1600 crystalloid, 700cc blood, 200cc urine Specimens:  Cord Blood, portion of right tube, portion of left tube Complications: None Apparent Disposition:  VS stable to PACU  Findings: normal uterus with filmy peritoneal adhesions anteriorly, normal tubes and ovaries bilaterally Live born female  Birth Weight: 9 lb 15.4 oz (4520 g) APGAR: 9, 9   Indication for procedure: 31 y.o. female at [redacted]w[redacted]d with history of 3 prior cesarean deliveries, and desired permanent sterilization.  Procedure Details   The risks, benefits, complications, treatment options, and expected outcomes were discussed with the patient. Informed consent was obtained. The patient was taken to Operating Room, identified as Amber Page and the procedure verified as a cesarean delivery with tubal ligation.   After administration of anesthesia, the patient was prepped and draped in the usual sterile manner, including a vaginal prep. A surgical time out was performed, with the pediatric team present. After confirming adequate anesthesia, a Pfannenstiel incision was made and carried down through the subcutaneous tissue to the fascia. Fascial incision was made and extended transversely. The fascia was separated from the underlying rectus tissue superiorly and inferiorly. The peritoneum was identified and entered. Peritoneal incision was extended longitudinally. There were filmy adhesions of the bladder and anterior peritoneum to the anterior serosa of  the uterus; these were divided and extended for adequate space for delivery.  A low transverse uterine incision was made. Delivered from cephalic presentation was a live born female . Delayed cord clamping was performed for 60 seconds while we sang Happy Birthday to [place]. The umbilical cord was doubly clamped and cut, and the baby was handed off to the awaitng pediatrician.  Cord blood was obtained for evaluation. The placenta was removed intact and appeared normal. The uterus was delivered from the abdominal cavity and cleared of clots, membranes, and debris. The uterus, tubes and ovaries appeared normal. The uterine incision was closed with running locking sutures of 0 Vicryl, and then a second, imbricating stitch was placed. Hemostasis was observed.   The attention was turned to the bilateral fallopian tubes.  The tubes were traced to their fimbriated ends.  The mesosalpinx was opened, and suture ligation was placed proximally and distally.  The portion of tube between the suture was divided and handed to nursing as portion of left tube and portion of right tube. These sites were hemostatic.  The abdominal cavity was evacuated of extraneous fluid. The uterus was returned to the abdominal cavity and again the incision was inspected for hemostasis, which was confirmed.  The paracolic gutters were cleared, and the tubal ligation sutures were observed to be intact.  The fascia was then reapproximated with running suture of vicryl. 60cc of Long- and short-acting bupivicaine was injected circumferentially into the fascia.  After a change of gloves, the subcutaneous tissue was irrigated and reapproximated with 3-0 vicryl. The skin was closed with 4-0 Monocryl and 40cc of long- and short-acting bupivacaine injected into the skin and subcutaneous tissues.  The incision was covered with surgical glue.    Instrument, sponge, and  needle counts were correct prior the abdominal closure and at the conclusion of the case.    I was present and performed this procedure in its entirety.  ----- Ranae Plumberhelsea Ward, MD Attending Obstetrician and Gynecologist [facility], Department of OB/GYN [facility]

## 2017-05-10 NOTE — Discharge Summary (Signed)
Obstetrical Discharge Summary  Patient Name: Amber Page DOB: Jul 10, 1986 MRN: [id number]  Date of Admission: 05/09/2017 Date of Delivery: 05/09/17 Delivered by: Ranae Plumberhelsea Ward, MD Date of Discharge: 05/11/17 [facility]  YNW:[id number]:inconsistent with dates, estimated 08/09/16, [facility] Estimated Date of Delivery: 05/16/17 Gestational Age at Delivery: 564w0d   Antepartum complications: h/o cesarean x3, asthma, intermittent tachycardia, obesity, S>D Admitting Diagnosis: scheduled CS at term Secondary Diagnosis: Patient Active Problem List   Diagnosis Date Noted  . Labor and delivery indication for care or intervention 05/08/2017  . S/P cesarean section 01/27/2016  . Postpartum care following cesarean delivery 01/27/2016  . Asthma 01/27/2016    Augmentation: n/a Complications: None Intrapartum complications/course: routine cesarean with BTL see op note Date of Delivery: 05/09/17 Delivered By: Leeroy Bockhelsea Ward Delivery Type: repeat cesarean section, low transverse incision, double layer closure Anesthesia: spinal Placenta: expressed Laceration: n/a Episiotomy: none Newborn Data: Live born female  Birth Weight: 9 lb 15.4 oz (4520 g) APGAR: 9, 9  Postpartum Procedures: none  Post partum course:  Patient had an uncomplicated postpartum course. Abdominal pain POD#1 which resolved by POD#2  By time of discharge on PPD#2, her pain was controlled on oral pain medications; she had appropriate lochia and was ambulating, voiding without difficulty and tolerating regular diet.  She was deemed stable for discharge to home.     Discharge Physical Exam: 05/11/17 BP 105/67 (BP Location: Right Arm)   Pulse 76   Temp 98.2 F (36.8 C) (Oral)   Resp 20   Ht 5\' 5"  (1.651 m)   Wt 103.4 kg (228 lb)   LMP  (LMP Unknown)   SpO2 97%   Breastfeeding? Unknown   BMI 37.94 kg/m   General: NAD CV: RRR Pulm: CTABL, nl effort ABD: s/nd/nt, fundus firm and below the  umbilicus Lochia: moderate Incision: c/d/i, covered in surgical glue DVT Evaluation: LE non-ttp, no evidence of DVT on exam.  Hemoglobin  Date Value Ref Range Status  05/10/2017 8.9 (L) 12.0 - 16.0 g/dL Final   HGB  Date Value Ref Range Status  04/17/2014 12.6 12.0 - 16.0 g/dL Final   HCT  Date Value Ref Range Status  05/10/2017 26.9 (L) 35.0 - 47.0 % Final  04/18/2014 31.2 (L) 35.0 - 47.0 % Final     Disposition: stable, discharge to home. Baby Feeding:  formula Baby Disposition: home with mom  Rh Immune globulin given: n/a Rubella vaccine given:  n/a Tdap vaccine given in AP or PP setting: AP Flu vaccine given in AP or PP setting: AP  Contraception: BTL  Prenatal Labs:  Blood type/Rh O+  Antibody screen neg  Rubella Immune  Varicella Immune  RPR Reactive, TP Ab neg  HBsAg Neg  HIV NR  GC neg  Chlamydia neg  Genetic screening negative  1 hour GTT Refused, Hb A1c 5.1  3 hour GTT   GBS Not collected - refused      Plan:  Amber Page was discharged to home in good condition. [facility] with Dr. Elesa MassedWard in 2 weeks  Discharge Medications: Allergies as of 05/11/2017   No Known Allergies     Medication List    TAKE these medications   albuterol 108 (90 Base) MCG/ACT inhaler Commonly known as:  PROVENTIL HFA;VENTOLIN HFA Inhale 2 puffs into the lungs every 6 (six) hours as needed for wheezing or shortness of breath.   docusate sodium 100 MG capsule Commonly known as:  COLACE Take  1 capsule (100 mg total) by mouth daily as needed.   Fluticasone-Salmeterol 250-50 MCG/DOSE Aepb Commonly known as:  ADVAIR Inhale 1 puff into the lungs 2 (two) times daily.   HYDROcodone-acetaminophen 5-325 MG tablet Commonly known as:  NORCO/VICODIN Take 1-2 tablets by mouth every 4 (four) hours as needed for moderate pain.   ibuprofen 600 MG tablet Commonly known as:  ADVIL,MOTRIN Take 1 tablet (600 mg total) by mouth every 6  (six) hours as needed for mild pain.   PRENATAL VITAMINS PO Take 1 tablet by mouth daily.   ranitidine 75 MG tablet Commonly known as:  ZANTAC Take 300 mg by mouth daily.       Follow-up Information    Ward, Elenora Fender, MD Follow up in 2 week(s).   Specialty:  Obstetrics and Gynecology Why:  Post op Contact information: [address] [facility] [place] [address] [phone]        Penny Pia, NP .   Contact information: [address] [place] [address] [phone]           Signed: Jennell Corner MD

## 2017-05-10 NOTE — Progress Notes (Signed)
Post Partum Day 2/POD#2 Subjective: Pt asking to go home today but, she is c/o "severe gas pains up to her shoulders" and due to gas has a hard time lying flat.   Objective: Blood pressure (!) 110/58, pulse 75, temperature 98.1 F (36.7 C), temperature source Oral, resp. rate 20, height 5\' 5"  (1.651 m), weight 228 lb (103.4 kg), SpO2 97 %, unknown if currently breastfeeding.  Physical Exam:  General: alert, cooperative and appears stated age  Heart: S1S2, RRR, No M/R/G. Lungs: CTA bilat, no W/R/R. Lochia: appropriate Uterine Fundus: firm Incision: C/D/I DVT Evaluation: Neg Homans   Recent Labs  05/08/17 1246 05/10/17 0534  HGB 11.0* 8.9*  HCT 34.1* 26.9*  WBC 10.0 14.1*  PLT 410 337    Assessment/Plan: A;1. POD#1 LTCS 2 Gas pains P: 1. Ambulate, ginger ale and enc to sit up and walk as much as poss. 2. Fe for anemia 3. May dc in am but, do not feel pt is ready to go home. 4. Will give a Fleets if gas not improved. She has had simethecone, Ducolax and still having gas pains.    LOS: 1 day   Sharee PimpleCaron W Jones 05/10/2017, 11:55 AM

## 2017-05-10 NOTE — Interval H&P Note (Signed)
History and Physical Interval Note:  05/09/17 07:20  Amber Page  has presented today for surgery, with the diagnosis of Prior cesarean  sterilization  The various methods of treatment have been discussed with the patient and family. After consideration of risks, benefits and other options for treatment, the patient has consented to  Procedure(s): CESAREAN SECTION WITH BILATERAL TUBAL LIGATION (Bilateral) as a surgical intervention .  The patient's history has been reviewed, patient examined, no change in status, stable for surgery.  I have reviewed the patient's chart and labs.  Questions were answered to the patient's satisfaction.     Amber Page

## 2017-05-11 MED ORDER — HYDROCODONE-ACETAMINOPHEN 5-325 MG PO TABS: 1.0000 | ORAL_TABLET | ORAL | 0 refills | Status: DC | PRN

## 2017-05-11 MED ORDER — IBUPROFEN 600 MG PO TABS: 600.0000 mg | ORAL_TABLET | Freq: Four times a day (QID) | ORAL | 0 refills | Status: AC | PRN

## 2017-05-11 MED ORDER — DOCUSATE SODIUM 100 MG PO CAPS: 100.0000 mg | ORAL_CAPSULE | Freq: Every day | ORAL | 2 refills | Status: AC | PRN

## 2017-05-11 NOTE — Progress Notes (Signed)
Reviewed all patients discharge instructions and handouts regarding postpartum bleeding, no intercourse for 6 weeks, signs and symptoms of mastitis and postpartum bleu's. Reviewed discharge instructions for newborn regarding proper cord care, how and when to bathe the newborn, nail care, proper way to take the baby's temperature, along with safe sleep. All questions have been answered at this time. Patient discharged via wheelchair with axillary.  

## 2017-08-23 IMAGING — CR DG ANKLE COMPLETE 3+V*L*
1 series · 3 of 3 positions shown · non-contrast
Comparison: None.

CLINICAL DATA: Initial evaluation for acute trauma, twisted left
ankle. Now with acute pain.

EXAM:
LEFT ANKLE COMPLETE - 3+ VIEW

[Series 1: x ankle ap left · 0.14mm/px · 3 of 3 slices shown]
[im 1/3]
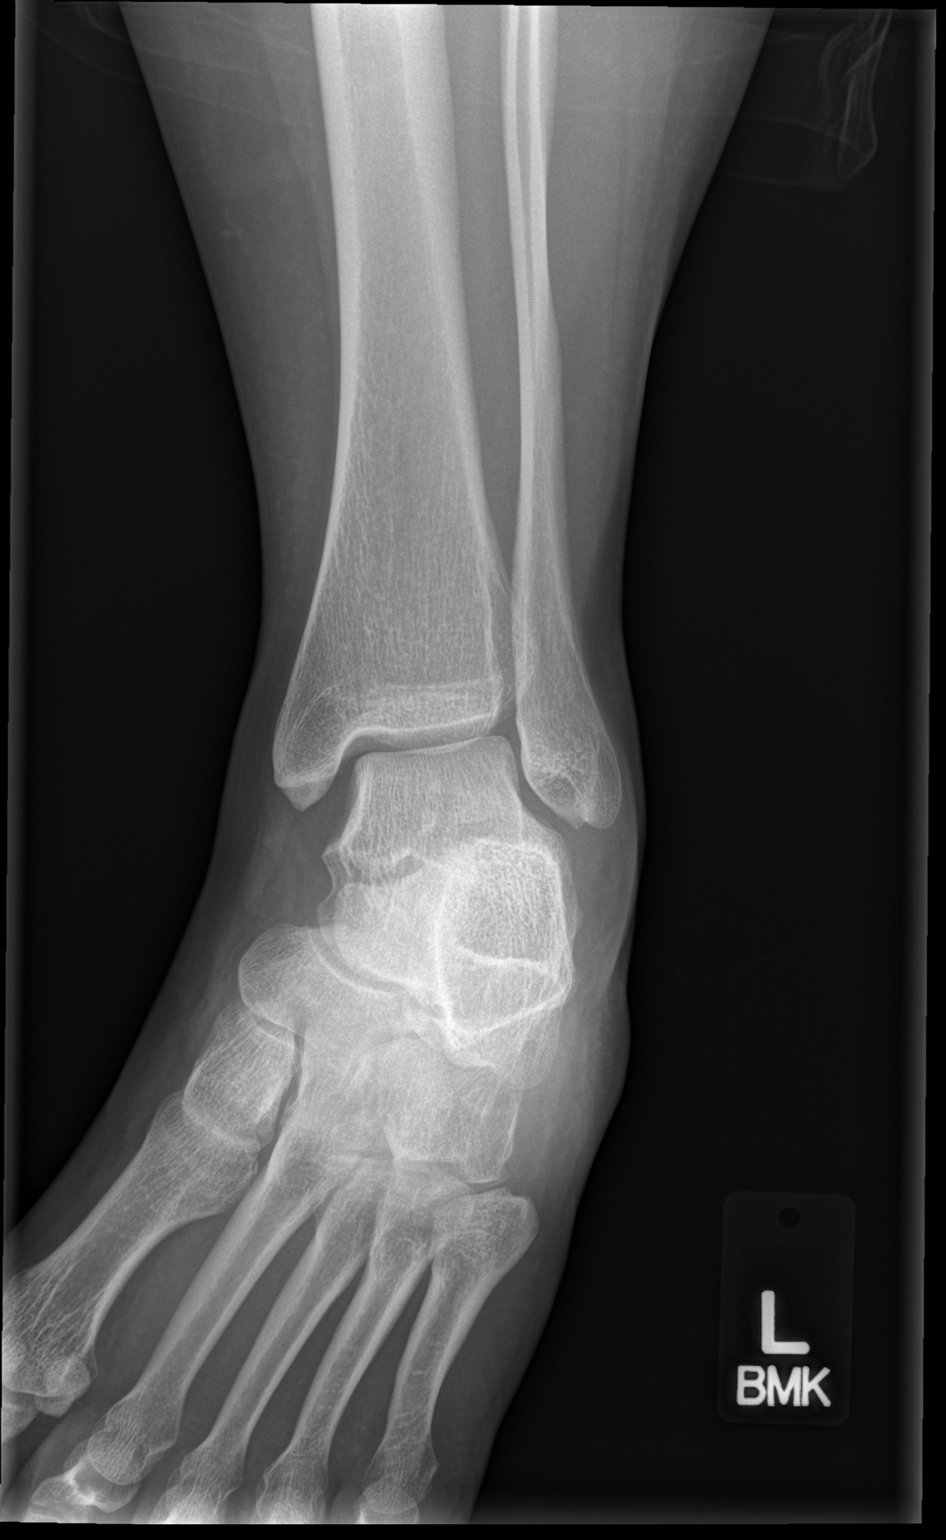
[im 2/3]
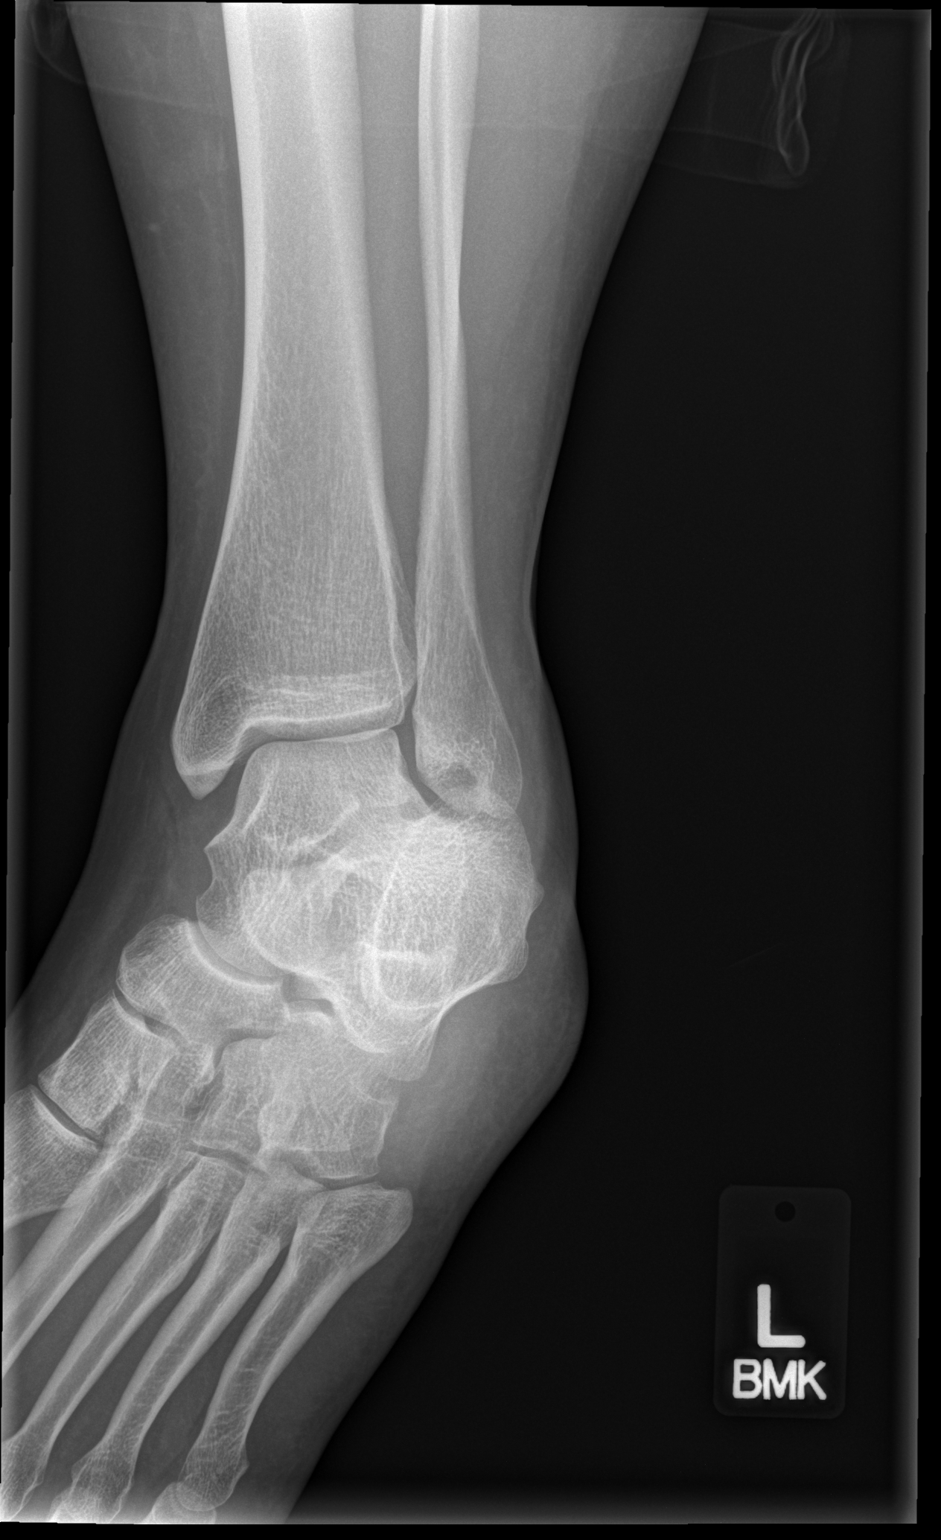
[im 3/3]
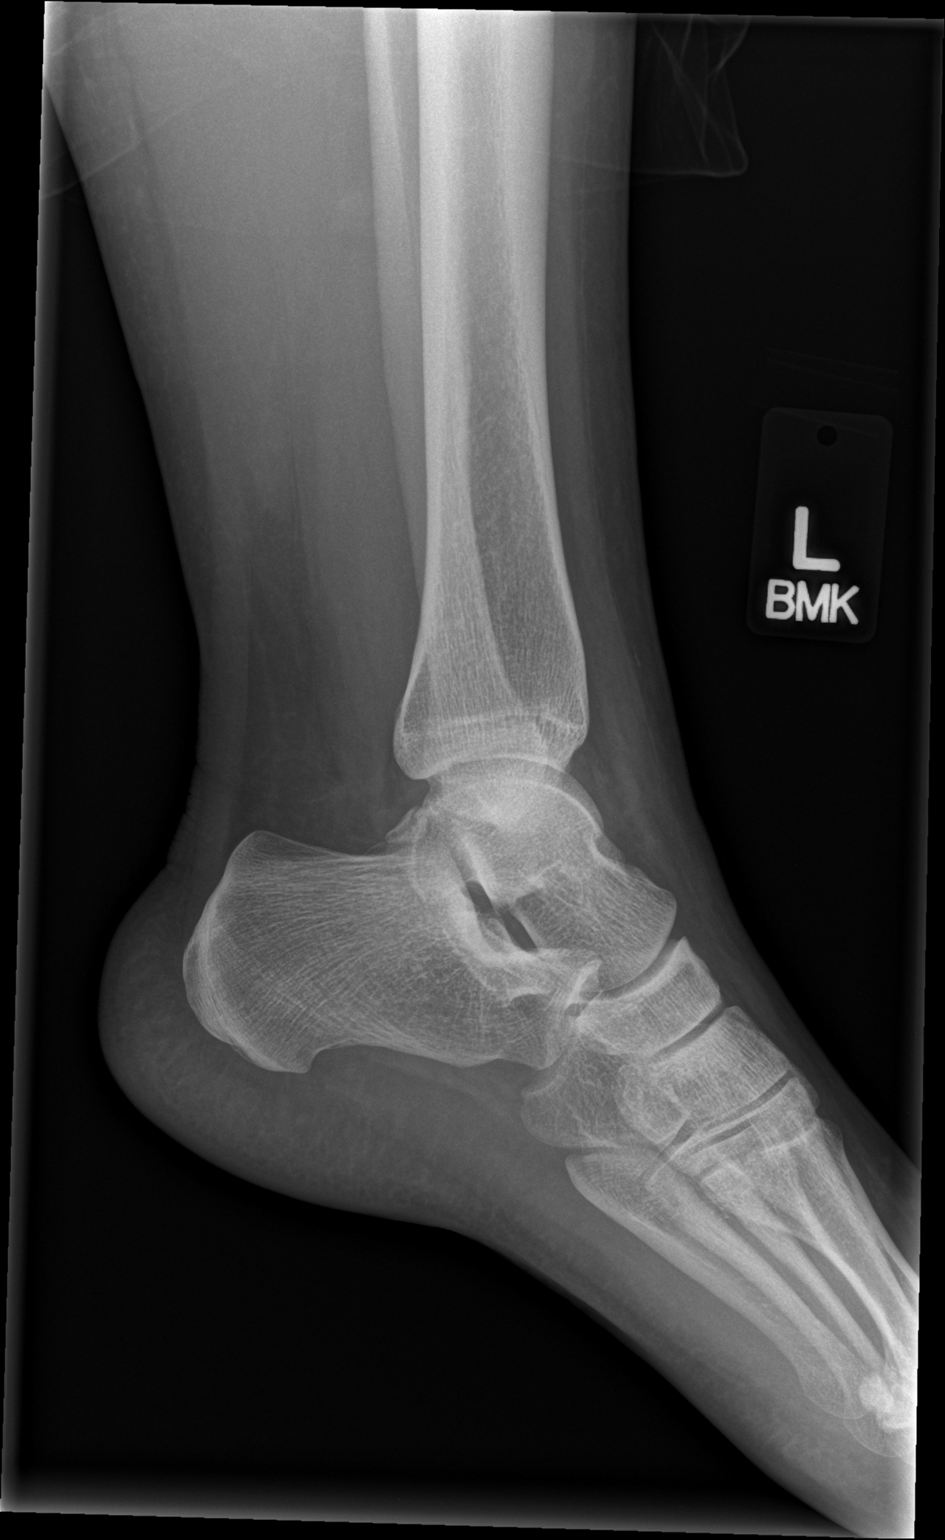

[3 of 3 positions shown; findings below may reference images not displayed]

FINDINGS: There is no evidence of fracture, dislocation, or joint effusion.
There is no evidence of arthropathy or other focal bone abnormality.
Soft tissues are unremarkable.
IMPRESSION: Negative.

## 2017-10-24 ENCOUNTER — Encounter: Payer: Self-pay | Admitting: Emergency Medicine

## 2017-10-24 ENCOUNTER — Emergency Department: Payer: Managed Care, Other (non HMO)

## 2017-10-24 ENCOUNTER — Inpatient Hospital Stay
Admission: EM | Admit: 2017-10-24 | Discharge: 2017-10-26 | DRG: 419 | Disposition: A | Discharge status: Home / Self Care | Payer: Managed Care, Other (non HMO) | Attending: Surgery | Admitting: Surgery

## 2017-10-24 ENCOUNTER — Other Ambulatory Visit: Payer: Self-pay

## 2017-10-24 DIAGNOSIS — K8012 Calculus of gallbladder with acute and chronic cholecystitis without obstruction: Principal | ICD-10-CM | POA: Diagnosis present

## 2017-10-24 DIAGNOSIS — K810 Acute cholecystitis: Secondary | ICD-10-CM

## 2017-10-24 DIAGNOSIS — R1011 Right upper quadrant pain: Secondary | ICD-10-CM

## 2017-10-24 DIAGNOSIS — J45909 Unspecified asthma, uncomplicated: Secondary | ICD-10-CM | POA: Diagnosis present

## 2017-10-24 DIAGNOSIS — K219 Gastro-esophageal reflux disease without esophagitis: Secondary | ICD-10-CM | POA: Diagnosis present

## 2017-10-24 HISTORY — DX: Acute cholecystitis: K81.0

## 2017-10-24 LAB — URINALYSIS, COMPLETE (UACMP) WITH MICROSCOPIC
BACTERIA UA: NONE SEEN
Bilirubin Urine: NEGATIVE
GLUCOSE, UA: NEGATIVE mg/dL
Hgb urine dipstick: NEGATIVE
KETONES UR: 20 mg/dL — AB
Leukocytes, UA: NEGATIVE
Nitrite: NEGATIVE
Protein, ur: NEGATIVE mg/dL
Specific Gravity, Urine: 1.018 (ref 1.005–1.030)
pH: 7 (ref 5.0–8.0)

## 2017-10-24 LAB — CBC
HEMATOCRIT: 36.9 % (ref 35.0–47.0)
HEMOGLOBIN: 12 g/dL (ref 12.0–16.0)
MCH: 27 pg (ref 26.0–34.0)
MCHC: 32.6 g/dL (ref 32.0–36.0)
MCV: 83 fL (ref 80.0–100.0)
Platelets: 453 10*3/uL — ABNORMAL HIGH (ref 150–440)
RBC: 4.45 MIL/uL (ref 3.80–5.20)
RDW: 15.2 % — ABNORMAL HIGH (ref 11.5–14.5)
WBC: 9.1 10*3/uL (ref 3.6–11.0)

## 2017-10-24 LAB — COMPREHENSIVE METABOLIC PANEL
ALBUMIN: 4.5 g/dL (ref 3.5–5.0)
ALT: 15 U/L (ref 14–54)
ANION GAP: 9 (ref 5–15)
AST: 19 U/L (ref 15–41)
Alkaline Phosphatase: 84 U/L (ref 38–126)
BUN: 6 mg/dL (ref 6–20)
CHLORIDE: 106 mmol/L (ref 101–111)
CO2: 23 mmol/L (ref 22–32)
Calcium: 9.4 mg/dL (ref 8.9–10.3)
Creatinine, Ser: 0.8 mg/dL (ref 0.44–1.00)
GFR calc non Af Amer: >60 mL/min (ref >60)
GLUCOSE: 96 mg/dL (ref 65–99)
POTASSIUM: 3.7 mmol/L (ref 3.5–5.1)
SODIUM: 138 mmol/L (ref 135–145)
Total Bilirubin: 0.4 mg/dL (ref 0.3–1.2)
Total Protein: 8.1 g/dL (ref 6.5–8.1)

## 2017-10-24 LAB — LIPASE, BLOOD: Lipase: 38 U/L (ref 11–51)

## 2017-10-24 LAB — POCT PREGNANCY, URINE: PREG TEST UR: NEGATIVE

## 2017-10-24 MED ORDER — ONDANSETRON HCL 4 MG PO TABS: 4.0000 mg | ORAL_TABLET | Freq: Four times a day (QID) | ORAL | Status: DC | PRN

## 2017-10-24 MED ORDER — PIPERACILLIN-TAZOBACTAM 3.375 G IVPB 30 MIN
3.3750 g | Freq: Once | INTRAVENOUS | Status: DC
  Filled 2017-10-24 (×2): qty 50

## 2017-10-24 MED ORDER — MORPHINE SULFATE (PF) 2 MG/ML IV SOLN
2.0000 mg | INTRAVENOUS | Status: DC | PRN
  Administered 2017-10-24 – 2017-10-25 (×2): 2 mg via INTRAVENOUS
  Filled 2017-10-24 (×2): qty 1

## 2017-10-24 MED ORDER — CEFAZOLIN SODIUM 1 G IJ SOLR
1000.0000 mg | Freq: Four times a day (QID) | INTRAMUSCULAR | Status: DC
  Administered 2017-10-24 – 2017-10-25 (×2): 1000 mg via INTRAVENOUS
  Filled 2017-10-24 (×5): qty 10

## 2017-10-24 MED ORDER — MORPHINE SULFATE (PF) 4 MG/ML IV SOLN
4.0000 mg | Freq: Once | INTRAVENOUS | Status: AC
  Administered 2017-10-24: 4 mg via INTRAVENOUS
  Filled 2017-10-24: qty 1

## 2017-10-24 MED ORDER — HEPARIN SODIUM (PORCINE) 5000 UNIT/ML IJ SOLN
5000.0000 [IU] | Freq: Three times a day (TID) | INTRAMUSCULAR | Status: DC
  Administered 2017-10-24 – 2017-10-25 (×2): 5000 [IU] via SUBCUTANEOUS
  Filled 2017-10-24 (×3): qty 1

## 2017-10-24 MED ORDER — ONDANSETRON HCL 4 MG/2ML IJ SOLN
4.0000 mg | Freq: Once | INTRAMUSCULAR | Status: AC
  Administered 2017-10-24: 4 mg via INTRAVENOUS
  Filled 2017-10-24: qty 2

## 2017-10-24 MED ORDER — CEFAZOLIN SODIUM-DEXTROSE 1-4 GM/50ML-% IV SOLN: 1.0000 g | Freq: Four times a day (QID) | INTRAVENOUS | Status: DC

## 2017-10-24 MED ORDER — DEXTROSE IN LACTATED RINGERS 5 % IV SOLN
INTRAVENOUS | Status: DC
  Administered 2017-10-24 – 2017-10-26 (×4): via INTRAVENOUS

## 2017-10-24 MED ORDER — SODIUM CHLORIDE 0.9 % IV BOLUS (SEPSIS)
1000.0000 mL | Freq: Once | INTRAVENOUS | Status: AC
Start: 2017-10-24 — End: 2017-10-24
  Administered 2017-10-24: 1000 mL via INTRAVENOUS

## 2017-10-24 MED ORDER — ALBUTEROL SULFATE (2.5 MG/3ML) 0.083% IN NEBU: 2.5000 mg | INHALATION_SOLUTION | Freq: Four times a day (QID) | RESPIRATORY_TRACT | Status: DC | PRN

## 2017-10-24 MED ORDER — MOMETASONE FURO-FORMOTEROL FUM 200-5 MCG/ACT IN AERO
2.0000 | INHALATION_SPRAY | Freq: Two times a day (BID) | RESPIRATORY_TRACT | Status: DC
  Administered 2017-10-25: 2 via RESPIRATORY_TRACT
  Filled 2017-10-24: qty 8.8

## 2017-10-24 MED ORDER — ONDANSETRON HCL 4 MG/2ML IJ SOLN
4.0000 mg | Freq: Four times a day (QID) | INTRAMUSCULAR | Status: DC | PRN
  Administered 2017-10-25: 4 mg via INTRAVENOUS

## 2017-10-24 NOTE — ED Notes (Signed)
[facility] , abd pain and tenderness x3 days

## 2017-10-24 NOTE — ED Notes (Signed)
Pt in [place]

## 2017-10-24 NOTE — H&P (Signed)
Amber Page is an 31 y.o. female.    Chief Complaint: Epigastric pain  HPI: This is a patient with 2 days of abdominal pains centered in the epigastrium.  She also has some in the right upper quadrant she has had nausea but no emesis denies fevers but has had some chills has had normal bowel movements no melena hematochezia or hematemesis.  She never had an episode like this before.  Has no family history of gallbladder disease.  He works for a [employer] does not smoke or drink  Patient also describes having trouble swallowing certain foods especially breads which seem to get stuck distally.  Past Medical History:  Diagnosis Date  . Asthma   . Complication of anesthesia    hypotension, bradycardia  . GERD (gastroesophageal reflux disease)     Past Surgical History:  Procedure Laterality Date  . BREAST REDUCTION SURGERY Bilateral   . BREAST SURGERY     reduction  . CESAREAN SECTION     x 2  . HAND TENDON SURGERY Right     Family History  Problem Relation Age of Onset  . Hypertension Mother    Social History:  reports that  has never smoked. she has never used smokeless tobacco. She reports that she does not drink alcohol or use drugs.  Allergies:  Allergies  Allergen Reactions  . Norco [Hydrocodone-Acetaminophen] Nausea And Vomiting  . Percocet [Oxycodone-Acetaminophen] Nausea And Vomiting     (Not in a hospital admission)   Review of Systems  Constitutional: Positive for chills. Negative for fever and weight loss.  HENT: Negative.   Eyes: Negative.   Respiratory: Negative.   Cardiovascular: Negative.   Gastrointestinal: Positive for abdominal pain and nausea. Negative for blood in stool, constipation, diarrhea, heartburn, melena and vomiting.  Genitourinary: Negative.   Musculoskeletal: Negative.   Skin: Negative.   Neurological: Negative.   Endo/Heme/Allergies: Negative.   Psychiatric/Behavioral: Negative.      Physical Exam:  BP  135/84 (BP Location: Right Arm)   Pulse 100   Temp (!) 97.5 F (36.4 C) (Oral)   Resp 18   Ht 5' 5"  (1.651 m)   Wt 190 lb (86.2 kg)   LMP 10/01/2017   SpO2 96%   Breastfeeding? No   BMI 31.62 kg/m   Physical Exam  Constitutional: She is oriented to person, place, and time and well-developed, well-nourished, and in no distress. No distress.  HENT:  Head: Normocephalic and atraumatic.  Eyes: Pupils are equal, round, and reactive to light. Right eye exhibits no discharge. Left eye exhibits no discharge. No scleral icterus.  Neck: Normal range of motion. No JVD present.  Cardiovascular: Normal rate, regular rhythm and normal heart sounds.  Pulmonary/Chest: Effort normal and breath sounds normal. No respiratory distress. She has no wheezes. She has no rales.  Abdominal: Soft. She exhibits no distension. There is tenderness. There is guarding. There is no rebound.  Tenderness in the epigastrium right upper quadrant with positive Murphy sign  Musculoskeletal: Normal range of motion. She exhibits no edema or tenderness.  Lymphadenopathy:    She has cervical adenopathy.  Neurological: She is alert and oriented to person, place, and time.  Skin: Skin is warm and dry. No rash noted. She is not diaphoretic. No erythema.  Psychiatric: Mood and affect normal.  Vitals reviewed.       Results for orders placed or performed during the hospital encounter of 10/24/17 (from the past 48 hour(s))  Lipase, blood  Status: None   Collection Time: 10/24/17  4:05 PM  Result Value Ref Range   Lipase 38 11 - 51 U/L  Comprehensive metabolic panel     Status: None   Collection Time: 10/24/17  4:05 PM  Result Value Ref Range   Sodium 138 135 - 145 mmol/L   Potassium 3.7 3.5 - 5.1 mmol/L   Chloride 106 101 - 111 mmol/L   CO2 23 22 - 32 mmol/L   Glucose, Bld 96 65 - 99 mg/dL   BUN 6 6 - 20 mg/dL   Creatinine, Ser 0.80 0.44 - 1.00 mg/dL   Calcium 9.4 8.9 - 10.3 mg/dL   Total Protein 8.1 6.5 - 8.1  g/dL   Albumin 4.5 3.5 - 5.0 g/dL   AST 19 15 - 41 U/L   ALT 15 14 - 54 U/L   Alkaline Phosphatase 84 38 - 126 U/L   Total Bilirubin 0.4 0.3 - 1.2 mg/dL   GFR calc non Af Amer >60 >60 mL/min   GFR calc Af Amer >60 >60 mL/min    Comment: (NOTE) The eGFR has been calculated using the CKD EPI equation. This calculation has not been validated in all clinical situations. eGFR's persistently <60 mL/min signify possible Chronic Kidney Disease.    Anion gap 9 5 - 15  CBC     Status: Abnormal   Collection Time: 10/24/17  4:05 PM  Result Value Ref Range   WBC 9.1 3.6 - 11.0 K/uL   RBC 4.45 3.80 - 5.20 MIL/uL   Hemoglobin 12.0 12.0 - 16.0 g/dL   HCT 36.9 35.0 - 47.0 %   MCV 83.0 80.0 - 100.0 fL   MCH 27.0 26.0 - 34.0 pg   MCHC 32.6 32.0 - 36.0 g/dL   RDW 15.2 (H) 11.5 - 14.5 %   Platelets 453 (H) 150 - 440 K/uL  Urinalysis, Complete w Microscopic     Status: Abnormal   Collection Time: 10/24/17  4:05 PM  Result Value Ref Range   Color, Urine YELLOW (A) YELLOW   APPearance TURBID (A) CLEAR   Specific Gravity, Urine 1.018 1.005 - 1.030   pH 7.0 5.0 - 8.0   Glucose, UA NEGATIVE NEGATIVE mg/dL   Hgb urine dipstick NEGATIVE NEGATIVE   Bilirubin Urine NEGATIVE NEGATIVE   Ketones, ur 20 (A) NEGATIVE mg/dL   Protein, ur NEGATIVE NEGATIVE mg/dL   Nitrite NEGATIVE NEGATIVE   Leukocytes, UA NEGATIVE NEGATIVE   RBC / HPF 0-5 0 - 5 RBC/hpf   WBC, UA 0-5 0 - 5 WBC/hpf   Bacteria, UA NONE SEEN NONE SEEN   Squamous Epithelial / LPF 6-30 (A) NONE SEEN   Amorphous Crystal PRESENT   Pregnancy, urine POC     Status: None   Collection Time: 10/24/17  4:14 PM  Result Value Ref Range   Preg Test, Ur NEGATIVE NEGATIVE    Comment:        THE SENSITIVITY OF THIS METHODOLOGY IS >24 mIU/mL    US Abdomen Limited Ruq  Result Date: 10/24/2017 CLINICAL DATA:  Initial evaluation for acute right upper quadrant postprandial pain with nausea. EXAM: ULTRASOUND ABDOMEN LIMITED RIGHT UPPER QUADRANT  COMPARISON:  None. FINDINGS: Gallbladder: Multiple shadowing echogenic stones present within the gallbladder wall lumen, largest of which measures approximately 1 cm. Gallbladder wall thickened up to 17 mm. Trace free pericholecystic fluid. Positive sonographic Percell Miller sign was elicited on exam. Common bile duct: Diameter: 11.5 mm Liver: No focal lesion identified. Within normal limits  in parenchymal echogenicity. Portal vein is patent on color Doppler imaging with normal direction of blood flow towards the liver. IMPRESSION: 1. Cholelithiasis with gallbladder wall thickening and positive sonographic Murphy sign. Clinical correlation for possible acute cholecystitis recommended. 2. Dilatation of the common bile duct up to 11.5 mm. No choledocholithiasis identified. Electronically Signed   By: Jeannine Boga M.D.   On: 10/24/2017 17:36     Assessment/Plan  Labs and ultrasound personally reviewed.  This is a patient with likely acute cholecystitis.  I recommended admission to the hospital and institution of [place] well as antiemetics.  I will start antibiotics as well.  We will plan laparoscopic cholecystectomy tomorrow.  The rationale for this was discussed the options of observation reviewed and the risks of bleeding infection failure to resolve all of her symptoms including her reflux and dysphasia symptoms which may require GI consultation in the future were discussed.  We discussed the risks of bowel injury or bile duct injury and conversion to an open procedure questions were answered for her she understood and agreed to proceed  Florene Glen, MD, FACS

## 2017-10-24 NOTE — ED Notes (Signed)
Last Drink: 1300 Last Eat: 0700

## 2017-10-24 NOTE — ED Triage Notes (Signed)
States mid abd pain that began Monday and is getting worse, states pain radiates to her back, with nausea, last BM this AM, pt standing in triage states sitting makes it worse

## 2017-10-24 NOTE — ED Provider Notes (Signed)
[facility] Emergency Department Provider Note  ____________________________________________  Time seen: Approximately 4:55 PM  I have reviewed the triage vital signs and the nursing notes.   HISTORY  Chief Complaint Abdominal Pain   HPI Amber Page is a 31 y.o. female with a history of GERD and asthma who presents for evaluation of abdominal pain. Patient reports constant sharp and severe abdominal pain located in the epigastric right upper quadrant region worse postprandially associated with nausea and chills. The pain has been constant for 2 days. Patient denies ever having similar pain. She has had C-sections in the past but no other abdominal surgeries. No vomiting, no diarrhea, no constipation, no dysuria, no hematuria, no chest pain, no shortness of breath, no cough, pain is not worse with inspiration. Denies any personal or family history of blood clots, recent travel or immobilization, leg pain or swelling, or exogenous hormones.  Past Medical History:  Diagnosis Date  . Asthma   . Complication of anesthesia    hypotension, bradycardia  . GERD (gastroesophageal reflux disease)     Patient Active Problem List   Diagnosis Date Noted  . Labor and delivery indication for care or intervention 05/08/2017  . S/P cesarean section 01/27/2016  . Postpartum care following cesarean delivery 01/27/2016  . Asthma 01/27/2016    Past Surgical History:  Procedure Laterality Date  . BREAST REDUCTION SURGERY Bilateral   . BREAST SURGERY     reduction  . CESAREAN SECTION     x 2  . HAND TENDON SURGERY Right     Prior to Admission medications   Medication Sig Start Date End Date Taking? Authorizing Provider  albuterol (PROVENTIL HFA;VENTOLIN HFA) 108 (90 Base) MCG/ACT inhaler Inhale 2 puffs into the lungs every 6 (six) hours as needed for wheezing or shortness of breath.    [provider]  docusate sodium (COLACE) 100 MG capsule Take 1 capsule  (100 mg total) by mouth daily as needed. 05/11/17 05/11/18  Schermerhorn, Ihor Austinhomas J, MD  Fluticasone-Salmeterol (ADVAIR) 250-50 MCG/DOSE AEPB Inhale 1 puff into the lungs 2 (two) times daily.    [provider]  HYDROcodone-acetaminophen (NORCO/VICODIN) 5-325 MG tablet Take 1-2 tablets by mouth every 4 (four) hours as needed for moderate pain. 05/11/17   Schermerhorn, Ihor Austinhomas J, MD  ibuprofen (ADVIL,MOTRIN) 600 MG tablet Take 1 tablet (600 mg total) by mouth every 6 (six) hours as needed for mild pain. 05/11/17   Schermerhorn, Ihor Austinhomas J, MD  Prenatal Multivit-Min-Fe-FA (PRENATAL VITAMINS PO) Take 1 tablet by mouth daily.     [provider]  ranitidine (ZANTAC) 75 MG tablet Take 300 mg by mouth daily.    [provider]    Allergies Patient has no known allergies.  Family History  Problem Relation Age of Onset  . Hypertension Mother     Social History Social History   Tobacco Use  . Smoking status: Never Smoker  . Smokeless tobacco: Never Used  Substance Use Topics  . Alcohol use: No  . Drug use: No    Review of Systems  Constitutional: Negative for fever. Eyes: Negative for visual changes. ENT: Negative for sore throat. Neck: No neck pain  Cardiovascular: Negative for chest pain. Respiratory: Negative for shortness of breath. Gastrointestinal: + epigastric/ RUQ abdominal pain, vomiting or diarrhea. Genitourinary: Negative for dysuria. Musculoskeletal: Negative for back pain. Skin: Negative for rash. Neurological: Negative for headaches, weakness or numbness. Psych: No SI or HI  ____________________________________________   PHYSICAL EXAM:  VITAL  SIGNS: ED Triage Vitals  Enc Vitals Group     BP 10/24/17 1600 135/84     Pulse Rate 10/24/17 1600 100     Resp 10/24/17 1600 18     Temp 10/24/17 1600 (!) 97.5 F (36.4 C)     Temp Source 10/24/17 1600 Oral     SpO2 10/24/17 1600 96 %     Weight 10/24/17 1601 190 lb (86.2 kg)     Height 10/24/17  1601 5\' 5"  (1.651 m)     Head Circumference --      Peak Flow --      Pain Score 10/24/17 1603 8     Pain Loc --      Pain Edu? --      Excl. in GC? --     Constitutional: Alert and oriented, looks uncomfortable but in no distress.  HEENT:      Head: Normocephalic and atraumatic.         Eyes: Conjunctivae are normal. Sclera is non-icteric.       Mouth/Throat: Mucous membranes are moist.       Neck: Supple with no signs of meningismus. Cardiovascular: Tachycardic with regular rhythm. No murmurs, gallops, or rubs. 2+ symmetrical distal pulses are present in all extremities. No JVD. Respiratory: Normal respiratory effort. Lungs are clear to auscultation bilaterally. No wheezes, crackles, or rhonchi.  Gastrointestinal: Soft, ttp over the epigastric region and RUQ with positive Murphy sign, and non distended with positive bowel sounds. No rebound or guarding. Genitourinary: No CVA tenderness. Musculoskeletal: Nontender with normal range of motion in all extremities. No edema, cyanosis, or erythema of extremities. Neurologic: Normal speech and language. Face is symmetric. Moving all extremities. No gross focal neurologic deficits are appreciated. Skin: Skin is warm, dry and intact. No rash noted. Psychiatric: Mood and affect are normal. Speech and behavior are normal.  ____________________________________________   LABS (all labs ordered are listed, but only abnormal results are displayed)  Labs Reviewed  CBC - Abnormal; Notable for the following components:      Result Value   RDW 15.2 (*)    Platelets 453 (*)    All other components within normal limits  URINALYSIS, COMPLETE (UACMP) WITH MICROSCOPIC - Abnormal; Notable for the following components:   Color, Urine YELLOW (*)    APPearance TURBID (*)    Ketones, ur 20 (*)    Squamous Epithelial / LPF 6-30 (*)    All other components within normal limits  LIPASE, BLOOD  COMPREHENSIVE METABOLIC PANEL  POC URINE PREG, ED  POCT  PREGNANCY, URINE   ____________________________________________  EKG  none  ____________________________________________  RADIOLOGY  RUQ US:  1. Cholelithiasis with gallbladder wall thickening and positive sonographic Murphy sign. Clinical correlation for possible acute cholecystitis recommended. 2. Dilatation of the common bile duct up to 11.5 mm. No choledocholithiasis identified. ____________________________________________   PROCEDURES  Procedure(s) performed: None Procedures Critical Care performed:  None ____________________________________________   INITIAL IMPRESSION / ASSESSMENT AND PLAN / ED COURSE   31 y.o. female with a history of GERD and asthma who presents for evaluation of 2 days of constant worse post-prandially epigastric and RUQ abdominal pain associated with nausea and chills. Patient is well-appearing, looks uncomfortable but in no distress, she is slightly tachycardic with a pulse of 100, afebrile and normotensive, her abdomen is soft with tenderness to palpation on the epigastric and right upper quadrant with positive Murphy sign. Differential diagnoses including cholecystitis versus symptomatic cholelithiasis versus pancreatitis versus GERD versus  peptic ulcer disease versus diverticulitis. Patient has no lower quadrant tenderness therefore no ovarian pathology suspected at this time.    _________________________ 5:47 PM on 10/24/2017 -----------------------------------------  [place] concerning for acute cholecystitis. Consulted Dr. Excell Seltzer for admission. Will give zosyn.  As part of my medical decision making, I reviewed the following data within the electronic MEDICAL RECORD NUMBER Nursing notes reviewed and incorporated, Labs reviewed , Radiograph reviewed , A consult was requested and obtained from this/these consultant(s) General Surgery, Notes from prior ED visits and [place] Controlled Substance Database    Pertinent labs & imaging results that were available  during my care of the patient were reviewed by me and considered in my medical decision making (see chart for details).    ____________________________________________   FINAL CLINICAL IMPRESSION(S) / ED DIAGNOSES  Final diagnoses:  RUQ abdominal pain  Acute cholecystitis      NEW MEDICATIONS STARTED DURING THIS VISIT:  This SmartLink is deprecated. Use AVSMEDLIST instead to display the medication list for a patient.   Note:  This document was prepared using Dragon voice recognition software and may include unintentional dictation errors.    Nita Sickle, MD 10/24/17 [phone]

## 2017-10-25 ENCOUNTER — Encounter
Admission: EM | Disposition: A | Discharge status: Home / Self Care | Payer: Self-pay | Source: Home / Self Care | Attending: Surgery

## 2017-10-25 ENCOUNTER — Encounter: Payer: Self-pay | Admitting: *Deleted

## 2017-10-25 ENCOUNTER — Inpatient Hospital Stay: Payer: Managed Care, Other (non HMO) | Admitting: Anesthesiology

## 2017-10-25 HISTORY — PX: CHOLECYSTECTOMY: SHX55

## 2017-10-25 LAB — COMPREHENSIVE METABOLIC PANEL
ALT: 11 U/L — ABNORMAL LOW (ref 14–54)
AST: 12 U/L — ABNORMAL LOW (ref 15–41)
Albumin: 3.3 g/dL — ABNORMAL LOW (ref 3.5–5.0)
Alkaline Phosphatase: 64 U/L (ref 38–126)
Anion gap: 7 (ref 5–15)
BUN: <5 mg/dL — ABNORMAL LOW (ref 6–20)
CO2: 23 mmol/L (ref 22–32)
Calcium: 8.2 mg/dL — ABNORMAL LOW (ref 8.9–10.3)
Chloride: 110 mmol/L (ref 101–111)
Creatinine, Ser: 0.65 mg/dL (ref 0.44–1.00)
GFR calc Af Amer: >60 mL/min (ref >60)
GFR calc non Af Amer: >60 mL/min (ref >60)
Glucose, Bld: 124 mg/dL — ABNORMAL HIGH (ref 65–99)
Potassium: 3.6 mmol/L (ref 3.5–5.1)
Sodium: 140 mmol/L (ref 135–145)
Total Bilirubin: 0.7 mg/dL (ref 0.3–1.2)
Total Protein: 6.1 g/dL — ABNORMAL LOW (ref 6.5–8.1)

## 2017-10-25 LAB — CBC
HCT: 31.9 % — ABNORMAL LOW (ref 35.0–47.0)
Hemoglobin: 10.6 g/dL — ABNORMAL LOW (ref 12.0–16.0)
MCH: 27.5 pg (ref 26.0–34.0)
MCHC: 33.1 g/dL (ref 32.0–36.0)
MCV: 82.9 fL (ref 80.0–100.0)
Platelets: 328 10*3/uL (ref 150–440)
RBC: 3.85 MIL/uL (ref 3.80–5.20)
RDW: 15.1 % — ABNORMAL HIGH (ref 11.5–14.5)
WBC: 5.8 10*3/uL (ref 3.6–11.0)

## 2017-10-25 LAB — SURGICAL PCR SCREEN
MRSA, PCR: NEGATIVE
STAPHYLOCOCCUS AUREUS: NEGATIVE

## 2017-10-25 SURGERY — LAPAROSCOPIC CHOLECYSTECTOMY
Anesthesia: General | Site: Abdomen | Wound class: Clean

## 2017-10-25 MED ORDER — DEXTROSE 5 % IV SOLN
1000.0000 mg | Freq: Four times a day (QID) | INTRAVENOUS | Status: DC
  Administered 2017-10-25 – 2017-10-26 (×3): 1000 mg via INTRAVENOUS
  Filled 2017-10-25 (×6): qty 10

## 2017-10-25 MED ORDER — KETOROLAC TROMETHAMINE 30 MG/ML IJ SOLN
INTRAMUSCULAR | Status: AC
  Filled 2017-10-25: qty 1

## 2017-10-25 MED ORDER — PROMETHAZINE HCL 25 MG/ML IJ SOLN
6.2500 mg | INTRAMUSCULAR | Status: DC | PRN
Start: 2017-10-25 — End: 2017-10-25
  Administered 2017-10-25: 12.5 mg via INTRAVENOUS

## 2017-10-25 MED ORDER — SUGAMMADEX SODIUM 200 MG/2ML IV SOLN
INTRAVENOUS | Status: DC | PRN
  Administered 2017-10-25: 172.4 mg via INTRAVENOUS

## 2017-10-25 MED ORDER — BUPIVACAINE-EPINEPHRINE (PF) 0.25% -1:200000 IJ SOLN
INTRAMUSCULAR | Status: DC | PRN
  Administered 2017-10-25: 30 mL via PERINEURAL

## 2017-10-25 MED ORDER — FENTANYL CITRATE (PF) 100 MCG/2ML IJ SOLN
INTRAMUSCULAR | Status: AC
  Filled 2017-10-25: qty 2

## 2017-10-25 MED ORDER — BUPIVACAINE-EPINEPHRINE (PF) 0.25% -1:200000 IJ SOLN
INTRAMUSCULAR | Status: AC
  Filled 2017-10-25: qty 30

## 2017-10-25 MED ORDER — KETOROLAC TROMETHAMINE 30 MG/ML IJ SOLN
INTRAMUSCULAR | Status: DC | PRN
  Administered 2017-10-25: 30 mg via INTRAVENOUS

## 2017-10-25 MED ORDER — SUCCINYLCHOLINE CHLORIDE 20 MG/ML IJ SOLN
INTRAMUSCULAR | Status: DC | PRN
Start: 2017-10-25 — End: 2017-10-25
  Administered 2017-10-25: 100 mg via INTRAVENOUS

## 2017-10-25 MED ORDER — ACETAMINOPHEN 10 MG/ML IV SOLN
INTRAVENOUS | Status: AC
  Filled 2017-10-25: qty 100

## 2017-10-25 MED ORDER — PROPOFOL 10 MG/ML IV BOLUS
INTRAVENOUS | Status: AC
  Filled 2017-10-25: qty 20

## 2017-10-25 MED ORDER — TRAMADOL HCL 50 MG PO TABS
50.0000 mg | ORAL_TABLET | Freq: Four times a day (QID) | ORAL | Status: DC | PRN
  Administered 2017-10-26 (×2): 50 mg via ORAL
  Filled 2017-10-25 (×2): qty 1

## 2017-10-25 MED ORDER — PROPOFOL 10 MG/ML IV BOLUS
INTRAVENOUS | Status: DC | PRN
  Administered 2017-10-25: 150 mg via INTRAVENOUS

## 2017-10-25 MED ORDER — MIDAZOLAM HCL 2 MG/2ML IJ SOLN
INTRAMUSCULAR | Status: AC
  Filled 2017-10-25: qty 2

## 2017-10-25 MED ORDER — LIDOCAINE HCL (CARDIAC) 20 MG/ML IV SOLN
INTRAVENOUS | Status: DC | PRN
  Administered 2017-10-25: 100 mg via INTRAVENOUS

## 2017-10-25 MED ORDER — SUCCINYLCHOLINE CHLORIDE 20 MG/ML IJ SOLN
INTRAMUSCULAR | Status: AC
  Filled 2017-10-25: qty 1

## 2017-10-25 MED ORDER — MIDAZOLAM HCL 2 MG/2ML IJ SOLN
INTRAMUSCULAR | Status: DC | PRN
  Administered 2017-10-25: 2 mg via INTRAVENOUS

## 2017-10-25 MED ORDER — ONDANSETRON HCL 4 MG/2ML IJ SOLN
INTRAMUSCULAR | Status: AC
  Filled 2017-10-25: qty 2

## 2017-10-25 MED ORDER — FENTANYL CITRATE (PF) 100 MCG/2ML IJ SOLN: 25.0000 ug | INTRAMUSCULAR | Status: DC | PRN

## 2017-10-25 MED ORDER — ROCURONIUM BROMIDE 50 MG/5ML IV SOLN
INTRAVENOUS | Status: AC
  Filled 2017-10-25: qty 1

## 2017-10-25 MED ORDER — CHLORHEXIDINE GLUCONATE CLOTH 2 % EX PADS
6.0000 | MEDICATED_PAD | CUTANEOUS | Status: AC
  Administered 2017-10-25: 6 via TOPICAL

## 2017-10-25 MED ORDER — DEXAMETHASONE SODIUM PHOSPHATE 10 MG/ML IJ SOLN
INTRAMUSCULAR | Status: DC | PRN
  Administered 2017-10-25: 10 mg via INTRAVENOUS

## 2017-10-25 MED ORDER — SODIUM CHLORIDE FLUSH 0.9 % IV SOLN
INTRAVENOUS | Status: AC
  Filled 2017-10-25: qty 10

## 2017-10-25 MED ORDER — FENTANYL CITRATE (PF) 100 MCG/2ML IJ SOLN
INTRAMUSCULAR | Status: DC | PRN
  Administered 2017-10-25: 100 ug via INTRAVENOUS
  Administered 2017-10-25: 50 ug via INTRAVENOUS

## 2017-10-25 MED ORDER — PROMETHAZINE HCL 25 MG/ML IJ SOLN
INTRAMUSCULAR | Status: AC
  Filled 2017-10-25: qty 1

## 2017-10-25 MED ORDER — ROCURONIUM BROMIDE 100 MG/10ML IV SOLN
INTRAVENOUS | Status: DC | PRN
  Administered 2017-10-25: 40 mg via INTRAVENOUS
  Administered 2017-10-25: 10 mg via INTRAVENOUS

## 2017-10-25 MED ORDER — LACTATED RINGERS IV SOLN
INTRAVENOUS | Status: DC
  Administered 2017-10-25: 11:00:00 via INTRAVENOUS

## 2017-10-25 MED ORDER — DEXAMETHASONE SODIUM PHOSPHATE 10 MG/ML IJ SOLN
INTRAMUSCULAR | Status: AC
  Filled 2017-10-25: qty 1

## 2017-10-25 MED ORDER — SUGAMMADEX SODIUM 200 MG/2ML IV SOLN
INTRAVENOUS | Status: AC
  Filled 2017-10-25: qty 2

## 2017-10-25 MED ORDER — ACETAMINOPHEN 10 MG/ML IV SOLN
INTRAVENOUS | Status: DC | PRN
  Administered 2017-10-25: 1000 mg via INTRAVENOUS

## 2017-10-25 MED ORDER — LIDOCAINE HCL (PF) 2 % IJ SOLN
INTRAMUSCULAR | Status: AC
  Filled 2017-10-25: qty 10

## 2017-10-25 SURGICAL SUPPLY — 43 items
ADHESIVE MASTISOL STRL (MISCELLANEOUS) ×3 IMPLANT
APPLIER CLIP ROT 10 11.4 M/L (STAPLE) ×3
BLADE SURG SZ11 CARB STEEL (BLADE) ×3 IMPLANT
CANISTER SUCT 1200ML W/VALVE (MISCELLANEOUS) ×3 IMPLANT
CATH CHOLANGI 4FR 420404F (CATHETERS) IMPLANT
CHLORAPREP W/TINT 26ML (MISCELLANEOUS) ×3 IMPLANT
CLIP APPLIE ROT 10 11.4 M/L (STAPLE) ×1 IMPLANT
CLOSURE WOUND 1/2 X4 (GAUZE/BANDAGES/DRESSINGS) ×1
CONRAY 60ML FOR OR (MISCELLANEOUS) IMPLANT
DRAPE C-ARM XRAY 36X54 (DRAPES) IMPLANT
ELECT REM PT RETURN 9FT ADLT (ELECTROSURGICAL) ×3
ELECTRODE REM PT RTRN 9FT ADLT (ELECTROSURGICAL) ×1 IMPLANT
GAUZE SPONGE NON-WVN 2X2 STRL (MISCELLANEOUS) ×4 IMPLANT
GLOVE BIO SURGEON STRL SZ8 (GLOVE) ×3 IMPLANT
GOWN STRL REUS W/ TWL LRG LVL3 (GOWN DISPOSABLE) ×4 IMPLANT
GOWN STRL REUS W/TWL LRG LVL3 (GOWN DISPOSABLE) ×8
IRRIGATION STRYKERFLOW (MISCELLANEOUS) ×1 IMPLANT
IRRIGATOR STRYKERFLOW (MISCELLANEOUS) ×3
IV CATH ANGIO 12GX3 LT BLUE (NEEDLE) ×3 IMPLANT
IV NS 1000ML (IV SOLUTION) ×2
IV NS 1000ML BAXH (IV SOLUTION) ×1 IMPLANT
JACKSON PRATT 10 (INSTRUMENTS) IMPLANT
KIT RM TURNOVER STRD PROC AR (KITS) ×3 IMPLANT
LABEL OR SOLS (LABEL) ×3 IMPLANT
NDL SAFETY 22GX1.5 (NEEDLE) ×3 IMPLANT
NEEDLE VERESS 14GA 120MM (NEEDLE) ×3 IMPLANT
NS IRRIG 500ML POUR BTL (IV SOLUTION) ×3 IMPLANT
PACK LAP CHOLECYSTECTOMY (MISCELLANEOUS) ×3 IMPLANT
POUCH SPECIMEN RETRIEVAL 10MM (ENDOMECHANICALS) ×3 IMPLANT
SCISSORS METZENBAUM CVD 33 (INSTRUMENTS) ×3 IMPLANT
SLEEVE ENDOPATH XCEL 5M (ENDOMECHANICALS) ×6 IMPLANT
SPONGE LAP 18X18 5 PK (GAUZE/BANDAGES/DRESSINGS) ×3 IMPLANT
SPONGE VERSALON 2X2 STRL (MISCELLANEOUS) ×8
SPONGE VERSALON 4X4 4PLY (MISCELLANEOUS) IMPLANT
STRIP CLOSURE SKIN 1/2X4 (GAUZE/BANDAGES/DRESSINGS) ×2 IMPLANT
SUT MNCRL 4-0 (SUTURE) ×2
SUT MNCRL 4-0 27XMFL (SUTURE) ×1
SUT VICRYL 0 AB UR-6 (SUTURE) ×3 IMPLANT
SUTURE MNCRL 4-0 27XMF (SUTURE) ×1 IMPLANT
SYR 20CC LL (SYRINGE) ×3 IMPLANT
TROCAR XCEL NON-BLD 11X100MML (ENDOMECHANICALS) ×3 IMPLANT
TROCAR XCEL NON-BLD 5MMX100MML (ENDOMECHANICALS) ×3 IMPLANT
TUBING INSUFFLATOR HI FLOW (MISCELLANEOUS) ×3 IMPLANT

## 2017-10-25 NOTE — Op Note (Signed)
Laparoscopic Cholecystectomy  Pre-operative Diagnosis: Acute cholecystitis  Post-operative Diagnosis: Acute edematous cholecystitis  Procedure: Laparoscopic cholecystectomy  Surgeon: Adah Salvageichard E. Excell Seltzerooper, MD FACS  Anesthesia: Gen. with endotracheal tube  Assistant: Surgical tech  Procedure Details  The patient was seen again in the Holding Room. The benefits, complications, treatment options, and expected outcomes were discussed with the patient. The risks of bleeding, infection, recurrence of symptoms, failure to resolve symptoms, bile duct damage, bile duct leak, retained common bile duct stone, bowel injury, any of which could require further surgery and/or ERCP, stent, or papillotomy were reviewed with the patient. The likelihood of improving the patient's symptoms with return to their baseline status is good.  The patient and/or family concurred with the proposed plan, giving informed consent.  The patient was taken to Operating Room, identified as Lucienne MinksBrianne Walkup and the procedure verified as Laparoscopic Cholecystectomy.  A Time Out was held and the above information confirmed.  Prior to the induction of general anesthesia, antibiotic prophylaxis was administered. VTE prophylaxis was in place. General endotracheal anesthesia was then administered and tolerated well. After the induction, the abdomen was prepped with Chloraprep and draped in the sterile fashion. The patient was positioned in the supine position.  Local anesthetic  was injected into the skin near the umbilicus and an incision made. The Veress needle was placed. Pneumoperitoneum was then created with CO2 and tolerated well without any adverse changes in the patient's vital signs. A 5mm port was placed in the periumbilical position and the abdominal cavity was explored.  Two 5-mm ports were placed in the right upper quadrant and a 12 mm epigastric port was placed all under direct vision. All skin incisions  were infiltrated with a  local anesthetic agent before making the incision and placing the trocars.   The patient was positioned  in reverse Trendelenburg, tilted slightly to the patient's left.  The gallbladder was identified, the fundus grasped and retracted cephalad. Adhesions were lysed bluntly. The infundibulum was grasped and retracted laterally, exposing the peritoneum overlying the triangle of Calot. This was then divided and exposed in a blunt fashion. A critical view of the cystic duct and cystic artery was obtained.  The cystic duct was clearly identified and bluntly dissected.   The cystic lymphatics were doubly clipped and divided.  This allowed for good visualization of the cystic duct as it entered the infundibulum of the gallbladder.  Here it was doubly clipped and divided.  The cystic artery was doubly clipped and divided.  The gallbladder was taken from the gallbladder fossa in a retrograde fashion with the electrocautery. The gallbladder was removed and placed in an Endocatch bag. The liver bed was irrigated and inspected. Hemostasis was achieved with the electrocautery. Copious irrigation was utilized and was repeatedly aspirated until clear.  The gallbladder and Endocatch sac were then removed through the epigastric port site.   Inspection of the right upper quadrant was performed. No bleeding, bile duct injury or leak, or bowel injury was noted. Pneumoperitoneum was released.  The epigastric port site was closed with figure-of-eight 0 Vicryl sutures. 4-0 subcuticular Monocryl was used to close the skin. Steristrips and Mastisol and sterile dressings were  applied.  The patient was then extubated and brought to the recovery room in stable condition. Sponge, lap, and needle counts were correct at closure and at the conclusion of the case.   Findings: Acute edematous cholecystitis   Estimated Blood Loss: Minimal         Drains: None  Specimens: Gallbladder           Complications: none                Lorriann Hansmann E. Excell Seltzerooper, MD, FACS

## 2017-10-25 NOTE — Progress Notes (Signed)
CC: Abdominal pain Subjective: Patient with epigastric and right upper quadrant pain which has improved considerably she has signs of acute cholecystitis but her pain and tenderness of improved.  She has no nausea or vomiting today.  Objective: Vital signs in last 24 hours: Temp:  [97.5 F (36.4 C)-98 F (36.7 C)] 98 F (36.7 C) (11/08 0525) Pulse Rate:  [64-100] 64 (11/08 0525) Resp:  [16-19] 19 (11/08 0525) BP: (108-135)/(73-84) 108/73 (11/08 0525) SpO2:  [96 %-100 %] 99 % (11/08 0525) Weight:  [190 lb (86.2 kg)-198 lb 9.6 oz (90.1 kg)] 198 lb 9.6 oz (90.1 kg) (11/07 2046) Last BM Date: 10/24/17  Intake/Output from previous day: 11/07 0701 - 11/08 0700 In: 1010 [I.V.:790; IV Piggyback:220] Out: -  Intake/Output this shift: No intake/output data recorded.  Physical exam:  No icterus no jaundice abdomen is soft minimally tender no mass.  Lab Results: CBC  Recent Labs    10/24/17 1605 10/25/17 0534  WBC 9.1 5.8  HGB 12.0 10.6*  HCT 36.9 31.9*  PLT 453* 328   BMET Recent Labs    10/24/17 1605 10/25/17 0534  NA 138 140  K 3.7 3.6  CL 106 110  CO2 23 23  GLUCOSE 96 124*  BUN 6 <5*  CREATININE 0.80 0.65  CALCIUM 9.4 8.2*   PT/INR No results for input(s): LABPROT, INR in the last 72 hours. ABG No results for input(s): PHART, HCO3 in the last 72 hours.  Invalid input(s): PCO2, PO2  Studies/Results: Kore[place] Abdomen Limited Ruq  Result Date: 10/24/2017 CLINICAL DATA:  Initial evaluation for acute right upper quadrant postprandial pain with nausea. EXAM: ULTRASOUND ABDOMEN LIMITED RIGHT UPPER QUADRANT COMPARISON:  None. FINDINGS: Gallbladder: Multiple shadowing echogenic stones present within the gallbladder wall lumen, largest of which measures approximately 1 cm. Gallbladder wall thickened up to 17 mm. Trace free pericholecystic fluid. Positive sonographic Eulah PontMurphy sign was elicited on exam. Common bile duct: Diameter: 11.5 mm Liver: No focal lesion identified. Within  normal limits in parenchymal echogenicity. Portal vein is patent on color Doppler imaging with normal direction of blood flow towards the liver. IMPRESSION: 1. Cholelithiasis with gallbladder wall thickening and positive sonographic Murphy sign. Clinical correlation for possible acute cholecystitis recommended. 2. Dilatation of the common bile duct up to 11.5 mm. No choledocholithiasis identified. Electronically Signed   By: Rise MuBenjamin  McClintock M.D.   On: 10/24/2017 17:36    Anti-infectives: Anti-infectives (From admission, onward)   Start     Dose/Rate Route Frequency Ordered Stop   10/25/17 0000  ceFAZolin (ANCEF) 1,000 mg in dextrose 5 % 100 mL IVPB     1,000 mg 220 mL/hr over 30 Minutes Intravenous Every 6 hours 10/24/17 2053     10/24/17 1815  ceFAZolin (ANCEF) IVPB 1 g/50 mL premix  Status:  Discontinued     1 g 100 mL/hr over 30 Minutes Intravenous Every 6 hours 10/24/17 1808 10/24/17 2052   10/24/17 1800  piperacillin-tazobactam (ZOSYN) IVPB 3.375 g  Status:  Discontinued     3.375 g 100 mL/hr over 30 Minutes Intravenous  Once 10/24/17 1745 10/24/17 1808      Assessment/Plan:  Labs reviewed showing no sign of choledocholithiasis.  Recommend laparoscopic cholecystectomy today.  The rationale for this was discussed the options of observation reviewed the risk of bleeding infection recurrence of symptoms failure to resolve all of her symptoms especially her dysphasia and possible reflux disease was discussed the risk of conversion to an open procedure were reviewed.  She understood and  agreed to proceed  Lattie Hawichard E Kyriaki Moder, MD, FACS  10/25/2017

## 2017-10-25 NOTE — Anesthesia Post-op Follow-up Note (Signed)
Anesthesia QCDR form completed.        

## 2017-10-25 NOTE — Transfer of Care (Signed)
Immediate Anesthesia Transfer of Care Note  Patient: Amber Page  Procedure(s) Performed: LAPAROSCOPIC CHOLECYSTECTOMY (N/A Abdomen)  Patient Location: PACU  Anesthesia Type:General  Level of Consciousness: awake, alert  and oriented  Airway & Oxygen Therapy: Patient Spontanous Breathing and Patient connected to face mask oxygen  Post-op Assessment: Report given to RN and Post -op Vital signs reviewed and stable  Post vital signs: Reviewed and stable  Last Vitals:  Vitals:   10/25/17 0525 10/25/17 1122  BP: 108/73 128/90  Pulse: 64 77  Resp: 19 12  Temp: 36.7 C 36.8 C  SpO2: 99% 100%    Last Pain:  Vitals:   10/25/17 1122  TempSrc: Temporal  PainSc: 0-No pain      Patients Stated Pain Goal: 2 (10/25/17 1050)  Complications: No apparent anesthesia complications

## 2017-10-25 NOTE — Anesthesia Preprocedure Evaluation (Signed)
Anesthesia Evaluation  Patient identified by MRN, date of birth, ID band Patient awake    Reviewed: Allergy & Precautions, H&P , NPO status , Patient's Chart, lab work & pertinent test results, reviewed documented beta blocker date and time   History of Anesthesia Complications (+) history of anesthetic complications  Airway Mallampati: III  TM Distance: >3 FB Neck ROM: full    Dental  (+) Chipped, Dental Advidsory Given, Teeth Intact   Pulmonary neg shortness of breath, asthma , neg sleep apnea, neg COPD, neg recent URI,           Cardiovascular Exercise Tolerance: Good negative cardio ROS       Neuro/Psych negative neurological ROS  negative psych ROS   GI/Hepatic negative GI ROS, Neg liver ROS,   Endo/Other  negative endocrine ROS  Renal/GU negative Renal ROS  negative genitourinary   Musculoskeletal   Abdominal   Peds  Hematology negative hematology ROS (+)   Anesthesia Other Findings Past Medical History: No date: Asthma No date: Complication of anesthesia     Comment:  hypotension, bradycardia No date: GERD (gastroesophageal reflux disease)   Reproductive/Obstetrics negative OB ROS                             Anesthesia Physical Anesthesia Plan  ASA: II  Anesthesia Plan: General   Post-op Pain Management:    Induction: Intravenous  PONV Risk Score and Plan: 3 and Ondansetron and Dexamethasone  Airway Management Planned: Oral ETT  Additional Equipment:   Intra-op Plan:   Post-operative Plan: Extubation in OR  Informed Consent: I have reviewed the patients History and Physical, chart, labs and discussed the procedure including the risks, benefits and alternatives for the proposed anesthesia with the patient or authorized representative who has indicated his/her understanding and acceptance.   Dental Advisory Given  Plan Discussed with: Anesthesiologist, CRNA and  Surgeon  Anesthesia Plan Comments:         Anesthesia Quick Evaluation

## 2017-10-25 NOTE — Anesthesia Procedure Notes (Signed)
Procedure Name: Intubation Date/Time: 10/25/2017 1:02 PM Performed by: Nelda Marseille, CRNA Pre-anesthesia Checklist: Patient identified, Patient being monitored, Timeout performed, Emergency Drugs available and Suction available Patient Re-evaluated:Patient Re-evaluated prior to induction Oxygen Delivery Method: Circle system utilized Preoxygenation: Pre-oxygenation with 100% oxygen Induction Type: IV induction Ventilation: Mask ventilation without difficulty Laryngoscope Size: Mac and 3 Grade View: Grade I Tube type: Oral Tube size: 7.0 mm Number of attempts: 1 Airway Equipment and Method: Stylet Placement Confirmation: ETT inserted through vocal cords under direct vision,  positive ETCO2 and breath sounds checked- equal and bilateral Secured at: 21 cm Tube secured with: Tape Dental Injury: Teeth and Oropharynx as per pre-operative assessment

## 2017-10-25 NOTE — Anesthesia Postprocedure Evaluation (Signed)
Anesthesia Post Note  Patient: Amber Page  Procedure(s) Performed: LAPAROSCOPIC CHOLECYSTECTOMY (N/A Abdomen)  Patient location during evaluation: PACU Anesthesia Type: General Level of consciousness: awake and alert Pain management: pain level controlled Vital Signs Assessment: post-procedure vital signs reviewed and stable Respiratory status: spontaneous breathing, nonlabored ventilation, respiratory function stable and patient connected to nasal cannula oxygen Cardiovascular status: blood pressure returned to baseline and stable Postop Assessment: no apparent nausea or vomiting Anesthetic complications: no     Last Vitals:  Vitals:   10/25/17 1415 10/25/17 1452  BP: 105/83 115/71  Pulse: 66 68  Resp: 14 12  Temp: 36.7 C 36.8 C  SpO2: 96% 96%    Last Pain:  Vitals:   10/25/17 1452  TempSrc: Oral  PainSc:                  Lenard SimmerAndrew Eligha Kmetz

## 2017-10-26 ENCOUNTER — Encounter: Payer: Self-pay | Admitting: Surgery

## 2017-10-26 LAB — CBC WITH DIFFERENTIAL/PLATELET
BASOS ABS: 0 10*3/uL (ref 0–0.1)
Basophils Relative: 0 %
EOS ABS: 0 10*3/uL (ref 0–0.7)
EOS PCT: 0 %
HCT: 31.4 % — ABNORMAL LOW (ref 35.0–47.0)
Hemoglobin: 10.1 g/dL — ABNORMAL LOW (ref 12.0–16.0)
LYMPHS PCT: 20 %
Lymphs Abs: 1.7 10*3/uL (ref 1.0–3.6)
MCH: 26.6 pg (ref 26.0–34.0)
MCHC: 32.2 g/dL (ref 32.0–36.0)
MCV: 82.7 fL (ref 80.0–100.0)
MONO ABS: 0.6 10*3/uL (ref 0.2–0.9)
Monocytes Relative: 7 %
Neutro Abs: 6.1 10*3/uL (ref 1.4–6.5)
Neutrophils Relative %: 73 %
PLATELETS: 361 10*3/uL (ref 150–440)
RBC: 3.79 MIL/uL — AB (ref 3.80–5.20)
RDW: 15.2 % — AB (ref 11.5–14.5)
WBC: 8.4 10*3/uL (ref 3.6–11.0)

## 2017-10-26 LAB — COMPREHENSIVE METABOLIC PANEL
ALK PHOS: 61 U/L (ref 38–126)
ALT: 14 U/L (ref 14–54)
AST: 14 U/L — AB (ref 15–41)
Albumin: 3.2 g/dL — ABNORMAL LOW (ref 3.5–5.0)
Anion gap: 6 (ref 5–15)
BILIRUBIN TOTAL: 0.4 mg/dL (ref 0.3–1.2)
CHLORIDE: 112 mmol/L — AB (ref 101–111)
CO2: 21 mmol/L — ABNORMAL LOW (ref 22–32)
CREATININE: 0.51 mg/dL (ref 0.44–1.00)
Calcium: 8.7 mg/dL — ABNORMAL LOW (ref 8.9–10.3)
GFR calc Af Amer: >60 mL/min (ref >60)
Glucose, Bld: 112 mg/dL — ABNORMAL HIGH (ref 65–99)
Potassium: 3.5 mmol/L (ref 3.5–5.1)
Sodium: 139 mmol/L (ref 135–145)
Total Protein: 6.2 g/dL — ABNORMAL LOW (ref 6.5–8.1)

## 2017-10-26 LAB — HIV ANTIBODY (ROUTINE TESTING W REFLEX): HIV SCREEN 4TH GENERATION: NONREACTIVE

## 2017-10-26 LAB — SURGICAL PATHOLOGY

## 2017-10-26 MED ORDER — TRAMADOL HCL 50 MG PO TABS: 50.0000 mg | ORAL_TABLET | Freq: Four times a day (QID) | ORAL | 0 refills | Status: AC | PRN

## 2017-10-26 NOTE — Discharge Summary (Signed)
Physician Discharge Summary  Patient ID: Amber Page MRN: [id number] DOB/AGE: [birth year] 31 y.o.  Admit date: 10/24/2017 Discharge date: 10/26/2017   Discharge Diagnoses:  Active Problems:   Acute cholecystitis   Procedures: Laparoscopic cholecystectomy  Hospital Course: This patient admitted to the hospital with signs of acute cholecystitis.  She was taken the operating room where edematous cholecystitis was confirmed.  Postoperatively she did well and is tolerating a regular diet will be discharged on oral analgesics with wound care instructions to follow-up in our office in 10 days.  He may shower  Consults: None  Disposition: 01-Home or Self Care   Allergies as of 10/26/2017      Reactions   Norco [hydrocodone-acetaminophen] Nausea And Vomiting   Percocet [oxycodone-acetaminophen] Nausea And Vomiting      Medication List    TAKE these medications   albuterol 108 (90 Base) MCG/ACT inhaler Commonly known as:  PROVENTIL HFA;VENTOLIN HFA Inhale 2 puffs into the lungs every 6 (six) hours as needed for wheezing or shortness of breath.   docusate sodium 100 MG capsule Commonly known as:  COLACE Take 1 capsule (100 mg total) by mouth daily as needed.   Fluticasone-Salmeterol 250-50 MCG/DOSE Aepb Commonly known as:  ADVAIR Inhale 1 puff into the lungs 2 (two) times daily.   HYDROcodone-acetaminophen 5-325 MG tablet Commonly known as:  NORCO/VICODIN Take 1-2 tablets by mouth every 4 (four) hours as needed for moderate pain.   ibuprofen 600 MG tablet Commonly known as:  ADVIL,MOTRIN Take 1 tablet (600 mg total) by mouth every 6 (six) hours as needed for mild pain.   MAGNESIUM PO Take 1 tablet daily by mouth.   Potassium 99 MG Tabs Take 1 tablet daily by mouth.   traMADol 50 MG tablet Commonly known as:  ULTRAM Take 1 tablet (50 mg total) every 6 (six) hours as needed by mouth for moderate pain.      Follow-up Information    Lattie Hawooper, Mallary Kreger E, MD Follow up in 2  day(s).   Specialty:  Surgery Contact information: [address] [place] [id number] [phone]           Lattie Hawichard E Aldona Bryner, MD, FACS

## 2017-10-26 NOTE — Discharge Instructions (Signed)
Remove dressing in 24 hours. °May shower in 24 hours. °Leave paper strips in place. °Resume all home medications. °Follow-up with Dr. Naida Escalante in 10 days. °

## 2017-10-26 NOTE — Progress Notes (Signed)
1 Day Post-Op  Subjective: Patient status post laparoscopic cholecystectomy for acute edematous cholecystitis.  She feels much better today has no pain and is ready for discharge.  Objective: Vital signs in last 24 hours: Temp:  [97.7 F (36.5 C)-99.3 F (37.4 C)] 98.2 F (36.8 C) (11/09 0622) Pulse Rate:  [64-87] 79 (11/09 0622) Resp:  [12-18] 18 (11/09 0622) BP: (105-128)/(66-90) 115/66 (11/09 0622) SpO2:  [96 %-100 %] 96 % (11/09 0622) Weight:  [190 lb (86.2 kg)] 190 lb (86.2 kg) (11/08 1122) Last BM Date: (P) 10/24/17  Intake/Output from previous day: 11/08 0701 - 11/09 0700 In: 3413 [P.O.:780; I.V.:2483; IV Piggyback:150] Out: 460 [Urine:450; Blood:10] Intake/Output this shift: No intake/output data recorded.  Physical exam:  No icterus no jaundice soft nontender abdomen wounds are clean  Lab Results: CBC  Recent Labs    10/25/17 0534 10/26/17 0419  WBC 5.8 8.4  HGB 10.6* 10.1*  HCT 31.9* 31.4*  PLT 328 361   BMET Recent Labs    10/25/17 0534 10/26/17 0419  NA 140 139  K 3.6 3.5  CL 110 112*  CO2 23 21*  GLUCOSE 124* 112*  BUN <5* <5*  CREATININE 0.65 0.51  CALCIUM 8.2* 8.7*   PT/INR No results for input(s): LABPROT, INR in the last 72 hours. ABG No results for input(s): PHART, HCO3 in the last 72 hours.  Invalid input(s): PCO2, PO2  Studies/Results: [place] Abdomen Limited Ruq  Result Date: 10/24/2017 CLINICAL DATA:  Initial evaluation for acute right upper quadrant postprandial pain with nausea. EXAM: ULTRASOUND ABDOMEN LIMITED RIGHT UPPER QUADRANT COMPARISON:  None. FINDINGS: Gallbladder: Multiple shadowing echogenic stones present within the gallbladder wall lumen, largest of which measures approximately 1 cm. Gallbladder wall thickened up to 17 mm. Trace free pericholecystic fluid. Positive sonographic Eulah PontMurphy sign was elicited on exam. Common bile duct: Diameter: 11.5 mm Liver: No focal lesion identified. Within normal limits in parenchymal  echogenicity. Portal vein is patent on color Doppler imaging with normal direction of blood flow towards the liver. IMPRESSION: 1. Cholelithiasis with gallbladder wall thickening and positive sonographic Murphy sign. Clinical correlation for possible acute cholecystitis recommended. 2. Dilatation of the common bile duct up to 11.5 mm. No choledocholithiasis identified. Electronically Signed   By: Rise MuBenjamin  McClintock M.D.   On: 10/24/2017 17:36    Anti-infectives: Anti-infectives (From admission, onward)   Start     Dose/Rate Route Frequency Ordered Stop   10/25/17 1800  ceFAZolin (ANCEF) 1,000 mg in dextrose 5 % 50 mL IVPB     1,000 mg 120 mL/hr over 30 Minutes Intravenous Every 6 hours 10/25/17 1411     10/25/17 0000  ceFAZolin (ANCEF) 1,000 mg in dextrose 5 % 100 mL IVPB  Status:  Discontinued     1,000 mg 220 mL/hr over 30 Minutes Intravenous Every 6 hours 10/24/17 2053 10/25/17 1411   10/24/17 1815  ceFAZolin (ANCEF) IVPB 1 g/50 mL premix  Status:  Discontinued     1 g 100 mL/hr over 30 Minutes Intravenous Every 6 hours 10/24/17 1808 10/24/17 2052   10/24/17 1800  piperacillin-tazobactam (ZOSYN) IVPB 3.375 g  Status:  Discontinued     3.375 g 100 mL/hr over 30 Minutes Intravenous  Once 10/24/17 1745 10/24/17 1808      Assessment/Plan: s/p Procedure(s): LAPAROSCOPIC CHOLECYSTECTOMY   LFTs are within normal limits.  Will advance diet patient doing very well discharged later today.  She will follow-up in our office in 10 days.  Lattie Hawichard E Cooper, MD, FACS  10/26/2017  

## 2017-10-29 ENCOUNTER — Encounter: Payer: Self-pay | Admitting: Emergency Medicine

## 2017-10-29 ENCOUNTER — Emergency Department: Payer: Managed Care, Other (non HMO)

## 2017-10-29 ENCOUNTER — Other Ambulatory Visit: Payer: Self-pay

## 2017-10-29 ENCOUNTER — Inpatient Hospital Stay
Admission: EM | Admit: 2017-10-29 | Discharge: 2017-11-02 | DRG: 440 | Disposition: A | Discharge status: Home / Self Care | Payer: Managed Care, Other (non HMO) | Attending: Surgery | Admitting: Surgery

## 2017-10-29 DIAGNOSIS — Z9049 Acquired absence of other specified parts of digestive tract: Secondary | ICD-10-CM

## 2017-10-29 DIAGNOSIS — K838 Other specified diseases of biliary tract: Secondary | ICD-10-CM | POA: Diagnosis not present

## 2017-10-29 DIAGNOSIS — K219 Gastro-esophageal reflux disease without esophagitis: Secondary | ICD-10-CM | POA: Diagnosis present

## 2017-10-29 DIAGNOSIS — Z79899 Other long term (current) drug therapy: Secondary | ICD-10-CM | POA: Diagnosis not present

## 2017-10-29 DIAGNOSIS — Z79891 Long term (current) use of opiate analgesic: Secondary | ICD-10-CM

## 2017-10-29 DIAGNOSIS — K851 Biliary acute pancreatitis without necrosis or infection: Secondary | ICD-10-CM | POA: Diagnosis present

## 2017-10-29 DIAGNOSIS — R945 Abnormal results of liver function studies: Secondary | ICD-10-CM

## 2017-10-29 DIAGNOSIS — Z885 Allergy status to narcotic agent status: Secondary | ICD-10-CM | POA: Diagnosis not present

## 2017-10-29 DIAGNOSIS — R748 Abnormal levels of other serum enzymes: Secondary | ICD-10-CM | POA: Diagnosis present

## 2017-10-29 DIAGNOSIS — R7989 Other specified abnormal findings of blood chemistry: Secondary | ICD-10-CM | POA: Diagnosis present

## 2017-10-29 DIAGNOSIS — K8020 Calculus of gallbladder without cholecystitis without obstruction: Secondary | ICD-10-CM

## 2017-10-29 DIAGNOSIS — R109 Unspecified abdominal pain: Secondary | ICD-10-CM

## 2017-10-29 DIAGNOSIS — O26843 Uterine size-date discrepancy, third trimester: Secondary | ICD-10-CM

## 2017-10-29 HISTORY — DX: Uterine size-date discrepancy, third trimester: O26.843

## 2017-10-29 HISTORY — DX: Acquired absence of other specified parts of digestive tract: Z90.49

## 2017-10-29 HISTORY — DX: Other specified abnormal findings of blood chemistry: R79.89

## 2017-10-29 LAB — COMPREHENSIVE METABOLIC PANEL
ALBUMIN: 4 g/dL (ref 3.5–5.0)
ALT: 523 U/L — AB (ref 14–54)
AST: 425 U/L — AB (ref 15–41)
Alkaline Phosphatase: 249 U/L — ABNORMAL HIGH (ref 38–126)
Anion gap: 8 (ref 5–15)
BUN: 5 mg/dL — AB (ref 6–20)
CHLORIDE: 109 mmol/L (ref 101–111)
CO2: 23 mmol/L (ref 22–32)
CREATININE: 0.64 mg/dL (ref 0.44–1.00)
Calcium: 8.9 mg/dL (ref 8.9–10.3)
GFR calc Af Amer: >60 mL/min (ref >60)
GLUCOSE: 129 mg/dL — AB (ref 65–99)
POTASSIUM: 3.4 mmol/L — AB (ref 3.5–5.1)
SODIUM: 140 mmol/L (ref 135–145)
Total Bilirubin: 1.6 mg/dL — ABNORMAL HIGH (ref 0.3–1.2)
Total Protein: 7.5 g/dL (ref 6.5–8.1)

## 2017-10-29 LAB — CBC
HEMATOCRIT: 38.3 % (ref 35.0–47.0)
Hemoglobin: 12.4 g/dL (ref 12.0–16.0)
MCH: 27 pg (ref 26.0–34.0)
MCHC: 32.3 g/dL (ref 32.0–36.0)
MCV: 83.4 fL (ref 80.0–100.0)
PLATELETS: 465 10*3/uL — AB (ref 150–440)
RBC: 4.6 MIL/uL (ref 3.80–5.20)
RDW: 15.7 % — ABNORMAL HIGH (ref 11.5–14.5)
WBC: 8.5 10*3/uL (ref 3.6–11.0)

## 2017-10-29 LAB — URINALYSIS, COMPLETE (UACMP) WITH MICROSCOPIC
Bilirubin Urine: NEGATIVE
Glucose, UA: NEGATIVE mg/dL
Hgb urine dipstick: NEGATIVE
Ketones, ur: NEGATIVE mg/dL
LEUKOCYTES UA: NEGATIVE
Nitrite: NEGATIVE
PH: 6 (ref 5.0–8.0)
PROTEIN: NEGATIVE mg/dL
SPECIFIC GRAVITY, URINE: 1.017 (ref 1.005–1.030)

## 2017-10-29 LAB — LIPASE, BLOOD: LIPASE: 9833 U/L — AB (ref 11–51)

## 2017-10-29 MED ORDER — KETOROLAC TROMETHAMINE 30 MG/ML IJ SOLN
30.0000 mg | Freq: Four times a day (QID) | INTRAMUSCULAR | Status: DC
  Administered 2017-10-29 – 2017-11-02 (×16): 30 mg via INTRAVENOUS
  Filled 2017-10-29 (×16): qty 1

## 2017-10-29 MED ORDER — MORPHINE SULFATE (PF) 4 MG/ML IV SOLN
4.0000 mg | Freq: Once | INTRAVENOUS | Status: AC
  Administered 2017-10-29: 4 mg via INTRAVENOUS

## 2017-10-29 MED ORDER — KETOROLAC TROMETHAMINE 30 MG/ML IJ SOLN
INTRAMUSCULAR | Status: AC
Start: 2017-10-29 — End: 2017-10-29
  Filled 2017-10-29: qty 1

## 2017-10-29 MED ORDER — ONDANSETRON HCL 4 MG/2ML IJ SOLN
4.0000 mg | Freq: Once | INTRAMUSCULAR | Status: AC
  Administered 2017-10-29: 4 mg via INTRAVENOUS

## 2017-10-29 MED ORDER — ONDANSETRON 4 MG PO TBDP: 4.0000 mg | ORAL_TABLET | Freq: Four times a day (QID) | ORAL | Status: DC | PRN

## 2017-10-29 MED ORDER — ONDANSETRON HCL 4 MG/2ML IJ SOLN
INTRAMUSCULAR | Status: AC
  Filled 2017-10-29: qty 2

## 2017-10-29 MED ORDER — ENOXAPARIN SODIUM 40 MG/0.4ML ~~LOC~~ SOLN
40.0000 mg | SUBCUTANEOUS | Status: DC
  Administered 2017-10-30 – 2017-10-31 (×2): 40 mg via SUBCUTANEOUS
  Filled 2017-10-29 (×5): qty 0.4

## 2017-10-29 MED ORDER — ONDANSETRON HCL 4 MG/2ML IJ SOLN
4.0000 mg | Freq: Four times a day (QID) | INTRAMUSCULAR | Status: DC | PRN
  Administered 2017-10-29 – 2017-10-30 (×3): 4 mg via INTRAVENOUS
  Filled 2017-10-29 (×3): qty 2

## 2017-10-29 MED ORDER — HYDROMORPHONE HCL 1 MG/ML IJ SOLN
0.5000 mg | INTRAMUSCULAR | Status: DC | PRN
  Administered 2017-10-29: 0.5 mg via INTRAVENOUS
  Filled 2017-10-29: qty 0.5

## 2017-10-29 MED ORDER — HYDROMORPHONE HCL 1 MG/ML IJ SOLN
1.0000 mg | INTRAMUSCULAR | Status: DC | PRN
  Administered 2017-10-29 – 2017-10-31 (×11): 1 mg via INTRAVENOUS
  Filled 2017-10-29 (×12): qty 1

## 2017-10-29 MED ORDER — LACTATED RINGERS IV SOLN
INTRAVENOUS | Status: DC
  Administered 2017-10-29 – 2017-10-30 (×3): via INTRAVENOUS

## 2017-10-29 MED ORDER — LACTATED RINGERS IV SOLN
125.0000 mL/h | INTRAVENOUS | Status: DC
  Administered 2017-10-29: 125 mL/h via INTRAVENOUS

## 2017-10-29 MED ORDER — PANTOPRAZOLE SODIUM 40 MG IV SOLR
40.0000 mg | Freq: Every day | INTRAVENOUS | Status: DC
  Administered 2017-10-29 – 2017-11-01 (×4): 40 mg via INTRAVENOUS
  Filled 2017-10-29 (×4): qty 40

## 2017-10-29 MED ORDER — MORPHINE SULFATE (PF) 2 MG/ML IV SOLN
2.0000 mg | Freq: Once | INTRAVENOUS | Status: AC
  Administered 2017-10-29: 2 mg via INTRAVENOUS
  Filled 2017-10-29: qty 1

## 2017-10-29 MED ORDER — POLYETHYLENE GLYCOL 3350 17 G PO PACK
17.0000 g | PACK | Freq: Every day | ORAL | Status: DC | PRN
Start: 2017-10-29 — End: 2017-11-02

## 2017-10-29 MED ORDER — LACTATED RINGERS IV BOLUS (SEPSIS)
1000.0000 mL | Freq: Once | INTRAVENOUS | Status: AC
  Administered 2017-10-29: 1000 mL via INTRAVENOUS

## 2017-10-29 MED ORDER — MORPHINE SULFATE (PF) 4 MG/ML IV SOLN
INTRAVENOUS | Status: AC
  Filled 2017-10-29: qty 1

## 2017-10-29 MED ORDER — PROMETHAZINE HCL 25 MG/ML IJ SOLN
12.5000 mg | Freq: Four times a day (QID) | INTRAMUSCULAR | Status: DC | PRN
  Administered 2017-10-29 – 2017-10-30 (×2): 12.5 mg via INTRAVENOUS
  Filled 2017-10-29 (×2): qty 1

## 2017-10-29 NOTE — ED Notes (Signed)
Pt reports having her cholecystectomy this past Thursday, performed here by Dr Excell Seltzerooper; pt says she's been feeling okay but able to tolerate liquids and relieve pain with tylenol or ibuprofen; pt says she woke around 5am with "dull but sharp" pain to epigastric area that radiates through to her back; nausea with vomiting since pain started; denies urinary s/s; abd nontender

## 2017-10-29 NOTE — Progress Notes (Signed)
Per Dr. Tobi BastosAnna place order for patient to have LR bolus at 999ml per hour. Will speak with Dr. Aleen CampiPiscoya in regard to continuing to increase fluids to treat pancreatitis.

## 2017-10-29 NOTE — ED Notes (Signed)
Report given to Valerie, RN.

## 2017-10-29 NOTE — ED Notes (Signed)
Dr. Webster at bedside.  

## 2017-10-29 NOTE — ED Triage Notes (Signed)
Patient ambulatory to triage with complaints of RUQ pain radiating to back describes pain as same pain that pt had prior to have gallbladder removed Thursday. Pt denies SHOB, vomiting or diaphoresis.  Pt grimacing and prefers to stand as sitting increases pain.  Pt denies discharge or s/sx of of infection at laparoscopic surgical site. "Sharp and dull" pain 9/10.  Pt reports nausea for the last few hours prior to arrival.   Speaking in complete coherent sentences.

## 2017-10-29 NOTE — H&P (Signed)
Date of Admission:  10/29/2017  Reason for Admission:  Abdominal pain and elevated LFTs  History of Present Illness: Amber Page is a 31 y.o. female s/p laparoscopic cholecystectomy on 11/8 with Dr. Excell Seltzerooper for acute cholecystitis.  She was discharged home on 11/9 and returns this morning with abdominal pain, nausea, and dry heaves.  These have started early this morning.  Denies any fevers but had some chills.  Has has nausea but no emesis with only dry heaving.  Her abdominal pain is in the epigastric region with radiation to the back, similar as her episode of cholecystitis last week.  Denies any constipation or diarrhea but somewhat loose stools, and denies changes to her stool color or her skin color.   In the ED, her workup included labs and ultrasound.  Her LFTs are elevated and has an elevated lipase which is still getting diluted to obtain a value.  Her U/S shows dilated CBD to 13 mm.  Past Medical History: Past Medical History:  Diagnosis Date  . Asthma   . Complication of anesthesia    hypotension, bradycardia  . GERD (gastroesophageal reflux disease)      Past Surgical History: Past Surgical History:  Procedure Laterality Date  . BREAST REDUCTION SURGERY Bilateral   . BREAST SURGERY     reduction  . CESAREAN SECTION     x 2  . HAND TENDON SURGERY Right     Home Medications: Prior to Admission medications   Medication Sig Start Date End Date Taking? Authorizing Provider  albuterol (PROVENTIL HFA;VENTOLIN HFA) 108 (90 Base) MCG/ACT inhaler Inhale 2 puffs into the lungs every 6 (six) hours as needed for wheezing or shortness of breath.   Yes [provider]  MAGNESIUM PO Take 1 tablet daily by mouth.   Yes [provider]  Potassium 99 MG TABS Take 1 tablet daily by mouth.   Yes [provider]  traMADol (ULTRAM) 50 MG tablet Take 1 tablet (50 mg total) every 6 (six) hours as needed by mouth for moderate pain. 10/26/17  Yes Lattie Hawooper, Richard E,  MD  docusate sodium (COLACE) 100 MG capsule Take 1 capsule (100 mg total) by mouth daily as needed. Patient not taking: Reported on 10/24/2017 05/11/17 05/11/18  Schermerhorn, Ihor Austinhomas J, MD  Fluticasone-Salmeterol (ADVAIR) 250-50 MCG/DOSE AEPB Inhale 1 puff into the lungs 2 (two) times daily.    [provider]  HYDROcodone-acetaminophen (NORCO/VICODIN) 5-325 MG tablet Take 1-2 tablets by mouth every 4 (four) hours as needed for moderate pain. Patient not taking: Reported on 10/24/2017 05/11/17   Schermerhorn, Ihor Austinhomas J, MD  ibuprofen (ADVIL,MOTRIN) 600 MG tablet Take 1 tablet (600 mg total) by mouth every 6 (six) hours as needed for mild pain. Patient not taking: Reported on 10/24/2017 05/11/17   Schermerhorn, Ihor Austinhomas J, MD    Allergies: Allergies  Allergen Reactions  . Norco [Hydrocodone-Acetaminophen] Nausea And Vomiting  . Percocet [Oxycodone-Acetaminophen] Nausea And Vomiting    Social History:  reports that  has never smoked. she has never used smokeless tobacco. She reports that she does not drink alcohol or use drugs.   Family History: Family History  Problem Relation Age of Onset  . Hypertension Mother     Review of Systems: Review of Systems  Constitutional: Positive for chills. Negative for fever.  HENT: Negative for hearing loss.   Eyes: Negative for blurred vision.  Respiratory: Negative for shortness of breath.   Cardiovascular: Negative for chest pain.  Gastrointestinal: Positive for abdominal pain and  nausea. Negative for blood in stool, constipation, diarrhea and vomiting.  Genitourinary: Negative for dysuria.  Musculoskeletal: Negative for myalgias.  Skin: Negative for rash.  Neurological: Negative for dizziness.  Psychiatric/Behavioral: Negative for depression.  All other systems reviewed and are negative.   Physical Exam BP 120/84 (BP Location: Left Arm)   Pulse 70   Temp 97.8 F (36.6 C)   Resp 16   Ht 5\' 5"  (1.651 m)   Wt 86.2 kg (190 lb)   LMP  10/28/2017   SpO2 98%   BMI 31.62 kg/m  CONSTITUTIONAL: No acute distress HEENT:  Normocephalic, atraumatic, extraocular motion intact. NECK: Trachea is midline, and there is no jugular venous distension.  RESPIRATORY:  Lungs are clear, and breath sounds are equal bilaterally. Normal respiratory effort without pathologic use of accessory muscles. CARDIOVASCULAR: Heart is regular without murmurs, gallops, or rubs. GI: The abdomen is soft, nondistended, with tenderness to palpation in the epigastric region.  Incisions from recent lap cholecystectomy are healing well, with steri strips in place and no evidence of infection.  MUSCULOSKELETAL:  Normal muscle strength and tone in all four extremities.  No peripheral edema or cyanosis. SKIN:  No jaundice.  NEUROLOGIC:  Motor and sensation is grossly normal.  Cranial nerves are grossly intact. PSYCH:  Alert and oriented to person, place and time. Affect is normal.  Laboratory Analysis: Results for orders placed or performed during the hospital encounter of 10/29/17 (from the past 24 hour(s))  Comprehensive metabolic panel     Status: Abnormal   Collection Time: 10/29/17  6:37 AM  Result Value Ref Range   Sodium 140 135 - 145 mmol/L   Potassium 3.4 (L) 3.5 - 5.1 mmol/L   Chloride 109 101 - 111 mmol/L   CO2 23 22 - 32 mmol/L   Glucose, Bld 129 (H) 65 - 99 mg/dL   BUN 5 (L) 6 - 20 mg/dL   Creatinine, Ser 1.61 0.44 - 1.00 mg/dL   Calcium 8.9 8.9 - 09.6 mg/dL   Total Protein 7.5 6.5 - 8.1 g/dL   Albumin 4.0 3.5 - 5.0 g/dL   AST 045 (H) 15 - 41 U/L   ALT 523 (H) 14 - 54 U/L   Alkaline Phosphatase 249 (H) 38 - 126 U/L   Total Bilirubin 1.6 (H) 0.3 - 1.2 mg/dL   GFR calc non Af Amer >60 >60 mL/min   GFR calc Af Amer >60 >60 mL/min   Anion gap 8 5 - 15  CBC     Status: Abnormal   Collection Time: 10/29/17  6:37 AM  Result Value Ref Range   WBC 8.5 3.6 - 11.0 K/uL   RBC 4.60 3.80 - 5.20 MIL/uL   Hemoglobin 12.4 12.0 - 16.0 g/dL   HCT 40.9  81.1 - 91.4 %   MCV 83.4 80.0 - 100.0 fL   MCH 27.0 26.0 - 34.0 pg   MCHC 32.3 32.0 - 36.0 g/dL   RDW 78.2 (H) 95.6 - 21.3 %   Platelets 465 (H) 150 - 440 K/uL    Imaging: US Abdomen Limited Ruq  Result Date: 10/29/2017 CLINICAL DATA:  Upper abdominal pain EXAM: ULTRASOUND ABDOMEN LIMITED RIGHT UPPER QUADRANT COMPARISON:  October 24, 2017 FINDINGS: Gallbladder: Gallbladder is surgically absent. A small amount of fluid is noted in the gallbladder fossa region. Common bile duct: Diameter: 13 mm, dilated. No mass or calculus evident by ultrasound. Note that the distal common bile duct is obscured by gas. There is also mild intrahepatic  biliary duct dilatation. Liver: No focal lesion identified. Within normal limits in parenchymal echogenicity. Portal vein is patent on color Doppler imaging with normal direction of blood flow towards the liver. IMPRESSION: Gallbladder absent. There is dilatation of the biliary ductal system with common bile duct measuring 13 mm. Note that the distal common bile duct is obscured by gas. In the visualized biliary ductal system, no mass or calculus is evident. Given this degree of biliary ductal system dilatation, further assessment may be warranted. From an imaging standpoint, MRCP would be the imaging study of choice for further assessment. No focal liver lesions evident. Minimal free fluid is noted in the gallbladder fossa region, a likely finding due to recent cholecystectomy. Electronically Signed   By: Bretta BangWilliam  Woodruff III M.D.   On: 10/29/2017 07:09    Assessment and Plan: This is a 31 y.o. female who presents with abdominal pain, nausea, and vomiting, with findings consistent with retained stone with elevated LFTs and dilated CBD.  I have independently viewed her imaging study and reviewed her laboratory studies.  Patient will be admitted to the surgical team.  She will be NPO with IV fluid hydration and appropriate pain and nausea control.  Discussed with the  patient that given her dilated ducts and elevated LFTs, there is likelyhood that she has a retained stone after her surgery and this in turn is causing her lab/ultrasound abnormalities and her abdominal pain.  Her lipase is still pending but appears that it is very elevated and likely reflects pancreatitis from retained stone. Will obtain a GI consult today for evaluation.  Discussed with patient that she may need an MRCP or ERCP given her findings.  No abx needed at this time.  Patient understands this plan and all of her questions have been answered.    Howie IllJose Luis Alanah Sakuma, MD [facility]

## 2017-10-29 NOTE — ED Notes (Signed)
Pt to US via stretcher with Aggie Cosierheresa

## 2017-10-29 NOTE — Progress Notes (Signed)
Pt complaining of nausea zofran not due for 1 hour. Per Dr. Aleen CampiPiscoya okay to place order for Phenergan IV 12.5mg  q 6 PRN for nausea and vomiting.

## 2017-10-29 NOTE — Progress Notes (Signed)
Per Dr. Tobi BastosAnna okay for patient to have a few ice chips.

## 2017-10-29 NOTE — Progress Notes (Signed)
Per Dr. Aleen CampiPiscoya update maintenance fluids to 175ml per hour following bolus.

## 2017-10-29 NOTE — ED Provider Notes (Signed)
[facility] Emergency Department Provider Note   ____________________________________________   First MD Initiated Contact with Patient 10/29/17 0622     (approximate)  I have reviewed the triage vital signs and the nursing notes.   HISTORY  Chief Complaint Abdominal Pain and Post-op Problem    HPI Amber Page is a 31 y.o. female who comes into the hospital today with abdominal pain.  The patient had a cholecystectomy on Thursday.  This morning she woke up and she was having some significant excruciating pain.  The patient states that she had been doing okay.  She is not taking pain medication due to a family history of addiction.  The patient reports that she has been having bowel movements and has been urinating well.  The patient reports that she has had some chills nausea and vomiting with some significant pain.  The patient denies any fevers or chest pain.  She has had some shortness of breath but reports that is because she is in pain.    Past Medical History:  Diagnosis Date  . Asthma   . Complication of anesthesia    hypotension, bradycardia  . GERD (gastroesophageal reflux disease)     Patient Active Problem List   Diagnosis Date Noted  . S/P laparoscopic cholecystectomy 10/29/2017  . Acute cholecystitis 10/24/2017  . Labor and delivery indication for care or intervention 05/08/2017  . S/P cesarean section 01/27/2016  . Postpartum care following cesarean delivery 01/27/2016  . Asthma 01/27/2016    Past Surgical History:  Procedure Laterality Date  . BREAST REDUCTION SURGERY Bilateral   . BREAST SURGERY     reduction  . CESAREAN SECTION     x 2  . HAND TENDON SURGERY Right     Prior to Admission medications   Medication Sig Start Date End Date Taking? Authorizing Provider  albuterol (PROVENTIL HFA;VENTOLIN HFA) 108 (90 Base) MCG/ACT inhaler Inhale 2 puffs into the lungs every 6 (six) hours as needed for wheezing or shortness of  breath.   Yes [provider]  MAGNESIUM PO Take 1 tablet daily by mouth.   Yes [provider]  Potassium 99 MG TABS Take 1 tablet daily by mouth.   Yes [provider]  traMADol (ULTRAM) 50 MG tablet Take 1 tablet (50 mg total) every 6 (six) hours as needed by mouth for moderate pain. 10/26/17  Yes Lattie Hawooper, Richard E, MD  docusate sodium (COLACE) 100 MG capsule Take 1 capsule (100 mg total) by mouth daily as needed. Patient not taking: Reported on 10/24/2017 05/11/17 05/11/18  Schermerhorn, Ihor Austinhomas J, MD  Fluticasone-Salmeterol (ADVAIR) 250-50 MCG/DOSE AEPB Inhale 1 puff into the lungs 2 (two) times daily.    [provider]  HYDROcodone-acetaminophen (NORCO/VICODIN) 5-325 MG tablet Take 1-2 tablets by mouth every 4 (four) hours as needed for moderate pain. Patient not taking: Reported on 10/24/2017 05/11/17   Schermerhorn, Ihor Austinhomas J, MD  ibuprofen (ADVIL,MOTRIN) 600 MG tablet Take 1 tablet (600 mg total) by mouth every 6 (six) hours as needed for mild pain. Patient not taking: Reported on 10/24/2017 05/11/17   Schermerhorn, Ihor Austinhomas J, MD    Allergies Norco [hydrocodone-acetaminophen] and Percocet [oxycodone-acetaminophen]  Family History  Problem Relation Age of Onset  . Hypertension Mother     Social History Social History   Tobacco Use  . Smoking status: Never Smoker  . Smokeless tobacco: Never Used  Substance Use Topics  . Alcohol use: No  . Drug use: No  Review of Systems  Constitutional: No fever/chills Eyes: No visual changes. ENT: No sore throat. Cardiovascular: Denies chest pain. Respiratory: Denies shortness of breath. Gastrointestinal: abdominal pain.   nausea,  vomiting.  No diarrhea.  No constipation. Genitourinary: Negative for dysuria. Musculoskeletal: Negative for back pain. Skin: Negative for rash. Neurological: Negative for headaches, focal weakness or numbness.   ____________________________________________   PHYSICAL  EXAM:  VITAL SIGNS: ED Triage Vitals  Enc Vitals Group     BP 10/29/17 0614 (!) 125/92     Pulse Rate 10/29/17 0614 98     Resp 10/29/17 0614 20     Temp 10/29/17 0614 97.8 F (36.6 C)     Temp Source 10/29/17 0614 Oral     SpO2 10/29/17 0614 99 %     Weight 10/29/17 0616 190 lb (86.2 kg)     Height 10/29/17 0616 5\' 5"  (1.651 m)     Head Circumference --      Peak Flow --      Pain Score 10/29/17 0613 9     Pain Loc --      Pain Edu? --      Excl. in GC? --     Constitutional: Alert and oriented. Well appearing and in significant distress. Eyes: Conjunctivae are normal. PERRL. EOMI. Head: Atraumatic. Nose: No congestion/rhinnorhea. Mouth/Throat: Mucous membranes are moist.  Oropharynx non-erythematous. Cardiovascular: Normal rate, regular rhythm. Grossly normal heart sounds.  Good peripheral circulation. Respiratory: Normal respiratory effort.  No retractions. Lungs CTAB. Gastrointestinal: Soft with some epigastric and right upper quadrant pain to palpation. No distention.  Positive bowel sounds Musculoskeletal: No lower extremity tenderness nor edema.   Neurologic:  Normal speech and language.  Skin:  Skin is warm, dry and intact.  Psychiatric: Mood and affect are normal.   ____________________________________________   LABS (all labs ordered are listed, but only abnormal results are displayed)  Labs Reviewed  COMPREHENSIVE METABOLIC PANEL - Abnormal; Notable for the following components:      Result Value   Potassium 3.4 (*)    Glucose, Bld 129 (*)    BUN 5 (*)    AST 425 (*)    ALT 523 (*)    Alkaline Phosphatase 249 (*)    Total Bilirubin 1.6 (*)    All other components within normal limits  CBC - Abnormal; Notable for the following components:   RDW 15.7 (*)    Platelets 465 (*)    All other components within normal limits  LIPASE, BLOOD  URINALYSIS, COMPLETE (UACMP) WITH MICROSCOPIC    ____________________________________________  EKG  none ____________________________________________  RADIOLOGY  [place] Abdomen Limited Ruq  Result Date: 10/29/2017 CLINICAL DATA:  Upper abdominal pain EXAM: ULTRASOUND ABDOMEN LIMITED RIGHT UPPER QUADRANT COMPARISON:  October 24, 2017 FINDINGS: Gallbladder: Gallbladder is surgically absent. A small amount of fluid is noted in the gallbladder fossa region. Common bile duct: Diameter: 13 mm, dilated. No mass or calculus evident by ultrasound. Note that the distal common bile duct is obscured by gas. There is also mild intrahepatic biliary duct dilatation. Liver: No focal lesion identified. Within normal limits in parenchymal echogenicity. Portal vein is patent on color Doppler imaging with normal direction of blood flow towards the liver. IMPRESSION: Gallbladder absent. There is dilatation of the biliary ductal system with common bile duct measuring 13 mm. Note that the distal common bile duct is obscured by gas. In the visualized biliary ductal system, no mass or calculus is evident. Given this degree of biliary ductal  system dilatation, further assessment may be warranted. From an imaging standpoint, MRCP would be the imaging study of choice for further assessment. No focal liver lesions evident. Minimal free fluid is noted in the gallbladder fossa region, a likely finding due to recent cholecystectomy. Electronically Signed   By: Bretta Bang III M.D.   On: 10/29/2017 07:09    ____________________________________________   PROCEDURES  Procedure(s) performed: None  Procedures  Critical Care performed: No  ____________________________________________   INITIAL IMPRESSION / ASSESSMENT AND PLAN / ED COURSE  As part of my medical decision making, I reviewed the following data within the electronic MEDICAL RECORD NUMBER Notes from prior ED visits and [place] Controlled Substance Database   This is a 31 year old female who comes into the  hospital today with abdominal pain.  She did have a recent cholecystectomy.  My differential diagnosis includes choledocholithiasis or bile duct stone, pancreatitis, surgical complication.  The patient did receive an ultrasound and some blood work.  The patient's ultrasound came back with a dilated common bile duct of 13 mm.  The patient also had some elevated liver enzymes.  I did contact the lab in regards to her lipase and they state that it is so high they have to diluted to get a result.  So the patient also does appear to have some pancreatitis.  I did give the patient a dose of morphine and Zofran for her pain.  I contacted the surgeon who will come down and admit the patient.  At this time we still do not have the results of the lipase.  She will be admitted for an ERCP and further evaluation.      ____________________________________________   FINAL CLINICAL IMPRESSION(S) / ED DIAGNOSES  Final diagnoses:  Abdominal pain  Common bile duct dilation  Acute biliary pancreatitis, unspecified complication status  Elevated liver enzymes     ED Discharge Orders    None       Note:  This document was prepared using Dragon voice recognition software and may include unintentional dictation errors.    Rebecka Apley, MD 10/29/17 [phone]

## 2017-10-29 NOTE — ED Notes (Signed)
Pt is alert and oriented. Pain is much better.  No needs at this time.

## 2017-10-29 NOTE — Consult Note (Addendum)
Amber MoodKiran Maxwel Page , MD [address], [address], [place], [place], [address] [address], [address], [place], [place], [address] Phone: [phone]  Fax: [phone]  Consultation  Referring Provider:  Dr Aleen CampiPiscoya Primary Care Physician:  [facility] Primary Gastroenterologist: None          Reason for Consultation:     Abnormal LFT's   Date of Admission:  10/29/2017 Date of Consultation:  10/29/2017         HPI:   Amber MinksBrianne Page is a 31 y.o. female who underwent a laparoscopic cholecystectomy on 10/25/2017 with Dr. Excell Seltzerooper with acute cholecystitis.  She was discharged home on 10/26/2017.  She presented to the ER earlier today with abdominal pain nausea which all began earlier today.  Denies any fever.  A right upper quadrant ultrasound done today shows that the gallop gallbladder is absent there is a dilation of the biliary ductal system with the common bile duct measuring 13 mm.  The radiologist recommended further assessment with an MRCP.  The ultrasound done on 10/24/2017 also did show that the dilation of the common bile duct 11.5 mm, cholelithiasis with gallbladder wall thickening and positive sonographic Murphy sign.  I do not see any evidence of an intrahepatic cholangiogram performed after the cholecystectomy.  On admission today the transaminases were elevated with AST of 425 ALT of 523 and total bilirubin of 1.6 alkaline phosphatase of 249 creatinine of 0.64.  Lipase elevated to 9833, hemoglobin of 12.4 platelet count of 465.   She says that since the cholecystectomy she has continued to have epigastric discomfort and this morning was suddenly woken up with severe central abdominal pain radiating to her back with nausea and vomiting .Still has severe pain , feels thirsty and dry. Denies any prior history of pancreatitis, family history of colon cancer .   Past Medical History:  Diagnosis Date  . Asthma   . Complication of anesthesia    hypotension, bradycardia  . GERD  (gastroesophageal reflux disease)     Past Surgical History:  Procedure Laterality Date  . BREAST REDUCTION SURGERY Bilateral   . BREAST SURGERY     reduction  . CESAREAN SECTION     x 2  . HAND TENDON SURGERY Right     Prior to Admission medications   Medication Sig Start Date End Date Taking? Authorizing Provider  albuterol (PROVENTIL HFA;VENTOLIN HFA) 108 (90 Base) MCG/ACT inhaler Inhale 2 puffs into the lungs every 6 (six) hours as needed for wheezing or shortness of breath.   Yes [provider]  MAGNESIUM PO Take 1 tablet daily by mouth.   Yes [provider]  Potassium 99 MG TABS Take 1 tablet daily by mouth.   Yes [provider]  traMADol (ULTRAM) 50 MG tablet Take 1 tablet (50 mg total) every 6 (six) hours as needed by mouth for moderate pain. 10/26/17  Yes Lattie Hawooper, Richard E, MD  docusate sodium (COLACE) 100 MG capsule Take 1 capsule (100 mg total) by mouth daily as needed. Patient not taking: Reported on 10/24/2017 05/11/17 05/11/18  Schermerhorn, Ihor Austinhomas J, MD  Fluticasone-Salmeterol (ADVAIR) 250-50 MCG/DOSE AEPB Inhale 1 puff into the lungs 2 (two) times daily.    [provider]  HYDROcodone-acetaminophen (NORCO/VICODIN) 5-325 MG tablet Take 1-2 tablets by mouth every 4 (four) hours as needed for moderate pain. Patient not taking: Reported on 10/24/2017 05/11/17   Schermerhorn, Ihor Austinhomas J, MD  ibuprofen (ADVIL,MOTRIN) 600 MG tablet Take 1 tablet (600 mg total) by mouth  every 6 (six) hours as needed for mild pain. Patient not taking: Reported on 10/24/2017 05/11/17   Schermerhorn, Ihor Austinhomas J, MD    Family History  Problem Relation Age of Onset  . Hypertension Mother      Social History   Tobacco Use  . Smoking status: Never Smoker  . Smokeless tobacco: Never Used  Substance Use Topics  . Alcohol use: No  . Drug use: No    Allergies as of 10/29/2017 - Review Complete 10/29/2017  Allergen Reaction Noted  . Percocet  [oxycodone-acetaminophen] Nausea And Vomiting 10/24/2017    Review of Systems:    All systems reviewed and negative except where noted in HPI.   Physical Exam:  Vital signs in last 24 hours: Temp:  [97.8 F (36.6 C)-98.2 F (36.8 C)] 98.2 F (36.8 C) (11/12 1221) Pulse Rate:  [70-98] 73 (11/12 1221) Resp:  [12-20] 12 (11/12 1221) BP: (120-130)/(82-97) 127/82 (11/12 1221) SpO2:  [98 %-100 %] 99 % (11/12 1221) Weight:  [190 lb (86.2 kg)] 190 lb (86.2 kg) (11/12 0616) Last BM Date: 10/27/17 General:   Pleasant, cooperative in NAD Head:  Normocephalic and atraumatic. Eyes:   No icterus.   Conjunctiva pink. PERRLA.Lips and mucus membranes appear very dry  Ears:  Normal auditory acuity. Neck:  Supple; no masses or thyroidomegaly Lungs: Respirations even and unlabored. Lungs clear to auscultation bilaterally.   No wheezes, crackles, or rhonchi.  Heart:  Regular rate and rhythm;  Without murmur, clicks, rubs or gallops Abdomen:  Soft, nondistended, nontender. Normal bowel sounds. No appreciable masses or hepatomegaly.  No rebound or guarding.  Neurologic:  Alert and oriented x3;  grossly normal neurologically. Skin:  Intact without significant lesions or rashes. Cervical Nodes:  No significant cervical adenopathy. Psych:  Alert and cooperative. Normal affect.  LAB RESULTS: Recent Labs    10/29/17 0637  WBC 8.5  HGB 12.4  HCT 38.3  PLT 465*   BMET Recent Labs    10/29/17 0637  NA 140  K 3.4*  CL 109  CO2 23  GLUCOSE 129*  BUN 5*  CREATININE 0.64  CALCIUM 8.9   LFT Recent Labs    10/29/17 0637  PROT 7.5  ALBUMIN 4.0  AST 425*  ALT 523*  ALKPHOS 249*  BILITOT 1.6*  BP 127/82 (BP Location: Left Arm)   Pulse 73   Temp 98.2 F (36.8 C) (Oral)   Resp 12   Ht 5\' 5"  (1.651 m)   Wt 190 lb (86.2 kg)   LMP 10/28/2017   SpO2 99%   BMI 31.62 kg/m   PT/INR No results for input(s): LABPROT, INR in the last 72 hours.  STUDIES: Koreas Abdomen Limited Ruq  Result  Date: 10/29/2017 CLINICAL DATA:  Upper abdominal pain EXAM: ULTRASOUND ABDOMEN LIMITED RIGHT UPPER QUADRANT COMPARISON:  October 24, 2017 FINDINGS: Gallbladder: Gallbladder is surgically absent. A small amount of fluid is noted in the gallbladder fossa region. Common bile duct: Diameter: 13 mm, dilated. No mass or calculus evident by ultrasound. Note that the distal common bile duct is obscured by gas. There is also mild intrahepatic biliary duct dilatation. Liver: No focal lesion identified. Within normal limits in parenchymal echogenicity. Portal vein is patent on color Doppler imaging with normal direction of blood flow towards the liver. IMPRESSION: Gallbladder absent. There is dilatation of the biliary ductal system with common bile duct measuring 13 mm. Note that the distal common bile duct is obscured by gas. In the visualized biliary ductal system,  no mass or calculus is evident. Given this degree of biliary ductal system dilatation, further assessment may be warranted. From an imaging standpoint, MRCP would be the imaging study of choice for further assessment. No focal liver lesions evident. Minimal free fluid is noted in the gallbladder fossa region, a likely finding due to recent cholecystectomy. Electronically Signed   By: Bretta Bang III M.D.   On: 10/29/2017 07:09      Impression / Plan:   Amber Page is a 31 y.o. y/o female ,My impression is that she has presented today with possible gallstone pancreatitis, she does have dilation  of her common bile duct today as well as the ultrasound that  was performed 5 days back,to  11.5 mm- I do not see an intraoperative cholangiogram.  I suggest treatment of acute pancreatitis with IV Ringer lactate, a liter bolus, analgesia  and subsequently reasses needs for further fluids in terms of volume. Presently appears dry, keep n.p.o. , obtain MRCP today and if it shows a stone in the common bile duct she would most likely require an ERCP tomorrow  watch for features of cholangitis and commenced on antibiotics if she were to develop signs suggestive of the same.  I have discussed alternative options to ERCP potentially needed for tomorrow , risks & benefits,  which include, but are not limited to, bleeding, pancreatitis, infection, perforation,respiratory complication & drug reaction.  The patient agrees with this plan & written consent will be obtained.    Thank you for involving me in the care of this patient.      LOS: 0 days   Amber Mood, MD  10/29/2017, 1:56 PM

## 2017-10-30 ENCOUNTER — Inpatient Hospital Stay: Payer: Managed Care, Other (non HMO)

## 2017-10-30 ENCOUNTER — Inpatient Hospital Stay: Payer: Managed Care, Other (non HMO) | Admitting: Anesthesiology

## 2017-10-30 ENCOUNTER — Encounter
Admission: EM | Disposition: A | Discharge status: Home / Self Care | Payer: Self-pay | Source: Home / Self Care | Attending: Surgery

## 2017-10-30 DIAGNOSIS — K851 Biliary acute pancreatitis without necrosis or infection: Principal | ICD-10-CM

## 2017-10-30 DIAGNOSIS — R748 Abnormal levels of other serum enzymes: Secondary | ICD-10-CM

## 2017-10-30 DIAGNOSIS — K838 Other specified diseases of biliary tract: Secondary | ICD-10-CM

## 2017-10-30 LAB — COMPREHENSIVE METABOLIC PANEL
ALBUMIN: 3.2 g/dL — AB (ref 3.5–5.0)
ALT: 348 U/L — ABNORMAL HIGH (ref 14–54)
ANION GAP: 8 (ref 5–15)
AST: 188 U/L — ABNORMAL HIGH (ref 15–41)
Alkaline Phosphatase: 219 U/L — ABNORMAL HIGH (ref 38–126)
BUN: 5 mg/dL — ABNORMAL LOW (ref 6–20)
CO2: 22 mmol/L (ref 22–32)
Calcium: 8.2 mg/dL — ABNORMAL LOW (ref 8.9–10.3)
Chloride: 107 mmol/L (ref 101–111)
Creatinine, Ser: 0.61 mg/dL (ref 0.44–1.00)
GFR calc non Af Amer: >60 mL/min (ref >60)
GLUCOSE: 90 mg/dL (ref 65–99)
Potassium: 3.5 mmol/L (ref 3.5–5.1)
SODIUM: 137 mmol/L (ref 135–145)
Total Bilirubin: 2.5 mg/dL — ABNORMAL HIGH (ref 0.3–1.2)
Total Protein: 6 g/dL — ABNORMAL LOW (ref 6.5–8.1)

## 2017-10-30 LAB — CBC WITH DIFFERENTIAL/PLATELET
BASOS PCT: 0 %
Basophils Absolute: 0 10*3/uL (ref 0–0.1)
EOS ABS: 0.1 10*3/uL (ref 0–0.7)
EOS PCT: 2 %
HCT: 34.5 % — ABNORMAL LOW (ref 35.0–47.0)
Hemoglobin: 11.1 g/dL — ABNORMAL LOW (ref 12.0–16.0)
LYMPHS ABS: 1 10*3/uL (ref 1.0–3.6)
Lymphocytes Relative: 13 %
MCH: 26.7 pg (ref 26.0–34.0)
MCHC: 32 g/dL (ref 32.0–36.0)
MCV: 83.4 fL (ref 80.0–100.0)
MONOS PCT: 7 %
Monocytes Absolute: 0.6 10*3/uL (ref 0.2–0.9)
NEUTROS PCT: 78 %
Neutro Abs: 6.2 10*3/uL (ref 1.4–6.5)
PLATELETS: 341 10*3/uL (ref 150–440)
RBC: 4.14 MIL/uL (ref 3.80–5.20)
RDW: 15.9 % — ABNORMAL HIGH (ref 11.5–14.5)
WBC: 8 10*3/uL (ref 3.6–11.0)

## 2017-10-30 LAB — PREGNANCY, URINE: PREG TEST UR: NEGATIVE

## 2017-10-30 LAB — MAGNESIUM
MAGNESIUM: 2.2 mg/dL (ref 1.7–2.4)
Magnesium: 1.5 mg/dL — ABNORMAL LOW (ref 1.7–2.4)

## 2017-10-30 LAB — LIPASE, BLOOD: Lipase: 1828 U/L — ABNORMAL HIGH (ref 11–51)

## 2017-10-30 SURGERY — ENDOSCOPIC RETROGRADE CHOLANGIOPANCREATOGRAPHY (ERCP) WITH PROPOFOL
Anesthesia: General

## 2017-10-30 MED ORDER — PROPOFOL 500 MG/50ML IV EMUL
INTRAVENOUS | Status: AC
  Filled 2017-10-30: qty 50

## 2017-10-30 MED ORDER — FENTANYL CITRATE (PF) 100 MCG/2ML IJ SOLN
INTRAMUSCULAR | Status: AC
  Administered 2017-10-30: 12.5 ug via INTRAVENOUS
  Filled 2017-10-30: qty 2

## 2017-10-30 MED ORDER — MAGNESIUM SULFATE 2 GM/50ML IV SOLN
2.0000 g | Freq: Once | INTRAVENOUS | Status: AC
  Administered 2017-10-30: 2 g via INTRAVENOUS
  Filled 2017-10-30: qty 50

## 2017-10-30 MED ORDER — ONDANSETRON HCL 4 MG/2ML IJ SOLN
INTRAMUSCULAR | Status: AC
  Administered 2017-10-30: 4 mg via INTRAVENOUS
  Filled 2017-10-30: qty 2

## 2017-10-30 MED ORDER — MIDAZOLAM HCL 2 MG/2ML IJ SOLN
INTRAMUSCULAR | Status: DC | PRN
  Administered 2017-10-30: 2 mg via INTRAVENOUS

## 2017-10-30 MED ORDER — PROPOFOL 10 MG/ML IV BOLUS
INTRAVENOUS | Status: DC | PRN
  Administered 2017-10-30: 50 mg via INTRAVENOUS

## 2017-10-30 MED ORDER — SODIUM CHLORIDE 0.9 % IV SOLN
1.5000 g | Freq: Once | INTRAVENOUS | Status: DC
  Filled 2017-10-30: qty 1.5

## 2017-10-30 MED ORDER — KCL IN DEXTROSE-NACL 20-5-0.9 MEQ/L-%-% IV SOLN
INTRAVENOUS | Status: DC
  Administered 2017-10-30 – 2017-11-01 (×6): via INTRAVENOUS
  Filled 2017-10-30 (×11): qty 1000

## 2017-10-30 MED ORDER — ALBUTEROL SULFATE HFA 108 (90 BASE) MCG/ACT IN AERS
INHALATION_SPRAY | RESPIRATORY_TRACT | Status: AC
  Filled 2017-10-30: qty 6.7

## 2017-10-30 MED ORDER — FENTANYL CITRATE (PF) 100 MCG/2ML IJ SOLN
12.5000 ug | INTRAMUSCULAR | Status: DC | PRN
  Administered 2017-10-30: 12.5 ug via INTRAVENOUS

## 2017-10-30 MED ORDER — MIDAZOLAM HCL 2 MG/2ML IJ SOLN
INTRAMUSCULAR | Status: AC
  Filled 2017-10-30: qty 2

## 2017-10-30 MED ORDER — PROPOFOL 500 MG/50ML IV EMUL
INTRAVENOUS | Status: DC | PRN
  Administered 2017-10-30: 130 ug/kg/min via INTRAVENOUS

## 2017-10-30 MED ORDER — INDOMETHACIN 50 MG RE SUPP
100.0000 mg | Freq: Once | RECTAL | Status: DC
  Filled 2017-10-30: qty 2

## 2017-10-30 MED ORDER — SODIUM CHLORIDE 0.9 % IV SOLN
INTRAVENOUS | Status: DC
  Administered 2017-10-30: 16:00:00 via INTRAVENOUS

## 2017-10-30 MED ORDER — GADOBENATE DIMEGLUMINE 529 MG/ML IV SOLN
20.0000 mL | Freq: Once | INTRAVENOUS | Status: AC | PRN
  Administered 2017-10-30: 18 mL via INTRAVENOUS

## 2017-10-30 MED ORDER — ONDANSETRON HCL 4 MG/2ML IJ SOLN
4.0000 mg | Freq: Once | INTRAMUSCULAR | Status: AC
  Administered 2017-10-30: 4 mg via INTRAVENOUS

## 2017-10-30 MED ORDER — INDOMETHACIN 50 MG RE SUPP
RECTAL | Status: DC | PRN
  Administered 2017-10-30: 100 mg via RECTAL

## 2017-10-30 NOTE — Transfer of Care (Signed)
Immediate Anesthesia Transfer of Care Note  Patient: Amber Page  Procedure(s) Performed: ENDOSCOPIC RETROGRADE CHOLANGIOPANCREATOGRAPHY (ERCP) WITH PROPOFOL (N/A )  Patient Location: PACU  Anesthesia Type:General  Level of Consciousness: sedated  Airway & Oxygen Therapy: Patient Spontanous Breathing and Patient connected to nasal cannula oxygen  Post-op Assessment: Report given to RN and Post -op Vital signs reviewed and stable  Post vital signs: Reviewed and stable  Last Vitals:  Vitals:   10/30/17 1530 10/30/17 1625  BP: 132/84 (!) 123/95  Pulse: (!) 101 90  Resp: 16   Temp: 37.8 C 37.9 C  SpO2: 100% 95%    Last Pain:  Vitals:   10/30/17 1625  TempSrc: Tympanic  PainSc: 6          Complications: No apparent anesthesia complications

## 2017-10-30 NOTE — Op Note (Signed)
[facility] Gastroenterology Patient Name: Amber Page Procedure Date: 10/30/2017 3:47 PM MRN: [id number] Account #: [id number] Date of Birth: [birth year] Admit Type: Inpatient Age: 31 Room: [facility] ENDO ROOM 4 Gender: Female Note Status: Finalized Procedure:            ERCP Indications:          Biliary dilation on Ultrasound, Elevated liver enzymes,                        Acute recurrent pancreatitis Providers:            Midge Miniumarren Maximillian Habibi MD, MD Referring MD:         [name] (Referring MD) Medicines:            Propofol per Anesthesia Complications:        No immediate complications. Procedure:            Pre-Anesthesia Assessment:                       - Prior to the procedure, a History and Physical was                        performed, and patient medications and allergies were                        reviewed. The patient's tolerance of previous                        anesthesia was also reviewed. The risks and benefits of                        the procedure and the sedation options and risks were                        discussed with the patient. All questions were                        answered, and informed consent was obtained. Prior                        Anticoagulants: The patient has taken no previous                        anticoagulant or antiplatelet agents. ASA Grade                        Assessment: II - A patient with mild systemic disease.                        After reviewing the risks and benefits, the patient was                        deemed in satisfactory condition to undergo the                        procedure.                       After obtaining informed consent, the scope was passed  under direct vision. Throughout the procedure, the                        patient's blood pressure, pulse, and oxygen saturations                        were monitored continuously. The ERCP was introduced              through the mouth, and used to inject contrast into and                        used to inject contrast into the bile duct. The ERCP                        was accomplished without difficulty. The patient                        tolerated the procedure well. Findings:      A scout film of the abdomen was obtained. Surgical clips, consistent       with previous cholecystectomy, were seen in the area of the. The       esophagus was successfully intubated under direct vision. The scope was       advanced to a normal major papilla in the descending duodenum without       detailed examination of the pharynx, larynx and associated structures,       and upper GI tract. The upper GI tract was grossly normal. The bile duct       was deeply cannulated with the short-nosed traction sphincterotome.       Contrast was injected. I personally interpreted the bile duct images.       There was brisk flow of contrast through the ducts. Image quality was       excellent. Contrast extended to the entire biliary tree. The lower third       of the main bile duct contained filling defect(s) thought to be sludge.       A wire was passed into the biliary tree. Biliary sphincterotomy was made       with a traction (standard) sphincterotome using ERBE electrocautery. The       sphincterotomy oozed blood. The biliary tree was swept with a 15 mm       balloon starting at the bifurcation. Sludge was swept from the duct. Impression:           - The examination was suspicious for sludge.                       - A biliary sphincterotomy was performed.                       - The biliary tree was swept and sludge was found. Recommendation:       - Clear liquid diet.                       - Watch for pancreatitis, bleeding, perforation, and                        cholangitis. Procedure Code(s):    --- Professional ---  [id number], Endoscopic retrograde cholangiopancreatography                         (ERCP); with removal of calculi/debris from                        biliary/pancreatic duct(s)                       43262, Endoscopic retrograde cholangiopancreatography                        (ERCP); with sphincterotomy/papillotomy                       [phone], Endoscopic catheterization of the biliary ductal                        system, radiological supervision and interpretation Diagnosis Code(s):    --- Professional ---                       R74.8, Abnormal levels of other serum enzymes                       K83.8, Other specified diseases of biliary tract                       K85.90, Acute pancreatitis without necrosis or                        infection, unspecified CPT copyright 2016 American Medical Association. All rights reserved. The codes documented in this report are preliminary and upon coder review may  be revised to meet current compliance requirements. Midge Miniumarren Naydeline Morace MD, MD 10/30/2017 4:20:05 PM This report has been signed electronically. Number of Addenda: 0 Note Initiated On: 10/30/2017 3:47 PM      [facility]

## 2017-10-30 NOTE — Anesthesia Procedure Notes (Signed)
Date/Time: 10/30/2017 4:05 PM Performed by: Junious SilkNoles, Keeghan Bialy, CRNA Pre-anesthesia Checklist: Patient identified, Emergency Drugs available, Suction available, Patient being monitored and Timeout performed Oxygen Delivery Method: Nasal cannula

## 2017-10-30 NOTE — Progress Notes (Signed)
10/30/2017  Subjective: No acute events overnight.  Patient had MRCP done overnight.  Per read, there is no choledocholithiasis noted on MRCP but there may be some enhancement at the ampulla.  She does have mild intra-hepatic and moderate extrahepatic biliary dilatation.  Plan is to proceed with ERCP today.  Her pain is better controlled but she still having upper abdominal pain consistent with her significant pancreatitis.  Vital signs: Temp:  [98.2 F (36.8 C)-99.1 F (37.3 C)] 98.8 F (37.1 C) (11/13 1237) Pulse Rate:  [74-77] 75 (11/13 1237) Resp:  [16-20] 16 (11/13 1237) BP: (116-121)/(74-84) 116/76 (11/13 1237) SpO2:  [95 %-100 %] 96 % (11/13 1237)   Intake/Output: 11/12 0701 - 11/13 0700 In: 2003.4 [I.V.:1403.4; IV Piggyback:600] Out: 1200 [Urine:1200] Last BM Date: 10/28/17  Physical Exam: Constitutional: No acute distress Abdomen: Soft, Mildly distended, with tenderness to palpation in the upper abdomen  Labs:  Recent Labs    10/29/17 0637 10/30/17 0449  WBC 8.5 8.0  HGB 12.4 11.1*  HCT 38.3 34.5*  PLT 465* 341   Recent Labs    10/29/17 0637 10/30/17 0449  NA 140 137  K 3.4* 3.5  CL 109 107  CO2 23 22  GLUCOSE 129* 90  BUN 5* 5*  CREATININE 0.64 0.61  CALCIUM 8.9 8.2*   No results for input(s): LABPROT, INR in the last 72 hours.  Imaging: Mr 3d Recon At Scanner  Result Date: 10/30/2017 CLINICAL DATA:  31 year old female status post laparoscopic cholecystectomy on 10/25/2017 for acute cholecystitis, with recent history of abdominal pain, nausea and dry heaves. EXAM: MRI ABDOMEN WITHOUT AND WITH CONTRAST (INCLUDING MRCP) TECHNIQUE: Multiplanar multisequence MR imaging of the abdomen was performed both before and after the administration of intravenous contrast. Heavily T2-weighted images of the biliary and pancreatic ducts were obtained, and three-dimensional MRCP images were rendered by post processing. CONTRAST:  18mL MULTIHANCE GADOBENATE DIMEGLUMINE  529 MG/ML IV SOLN COMPARISON:  None. FINDINGS: Lower chest: Trace left pleural effusion. Hepatobiliary: No suspicious cystic or solid hepatic lesions. MRCP images demonstrate mild intrahepatic and moderate extrahepatic biliary ductal dilatation, with the common bile duct measuring up to 1.4 cm in diameter. There are no filling defects within the common bile duct to suggest retained choledocholithiasis. Status post cholecystectomy. Pancreas: No pancreatic mass. MRCP images demonstrated diffuse pancreatic ductal ectasia and some mild pancreatic ductal dilatation most evident in the head of the pancreas where the duct measures up to 5 mm. However, follow-up axial T2 images obtained 10 hours after the initial images were obtained (to reduce artifact, and in association with repeat postcontrast images) demonstrate no pancreatic ductal dilatation. Subtle enhancement of the ampulla best appreciated on delayed post gadolinium images (axial image 72 of series 41). Large amount of T2 hyperintense fluid adjacent to the pancreas, concerning for underlying pancreatitis. Spleen:  Unremarkable. Adrenals/Urinary Tract: Bilateral kidneys and bilateral adrenal glands are normal in appearance. No hydroureteronephrosis. Stomach/Bowel: Visualized portions are unremarkable. Vascular/Lymphatic: No aneurysm identified in the visualized abdominal vasculature. No lymphadenopathy noted in the abdomen. Other: Inflammatory changes throughout the retroperitoneum where there is a large amount of peripancreatic and other retroperitoneal fluid. Small volume of ascites. Musculoskeletal: No aggressive appearing osseous lesions are noted in the visualized portions of the skeleton. IMPRESSION: 1. Mild intrahepatic and moderate extrahepatic biliary ductal dilatation. No evidence of retained choledocholithiasis in this post cholecystectomy patient. There is some subtle enhancement of the ampulla. The possibility of an underlying ampullary mass should be  considered. Alternatively, these findings could  relate to recent passage of a stone. 2. Extensive inflammatory changes around the pancreas indicative of an acute pancreatitis. No definite evidence of pancreatic necrosis or discrete pancreatic pseudocyst identified at this time. 3. Trace left pleural effusion. Electronically Signed   By: Trudie Reedaniel  Entrikin M.D.   On: 10/30/2017 11:51   Mr Abdomen Mrcp Vivien RossettiW Wo Contast  Result Date: 10/30/2017 CLINICAL DATA:  31 year old female status post laparoscopic cholecystectomy on 10/25/2017 for acute cholecystitis, with recent history of abdominal pain, nausea and dry heaves. EXAM: MRI ABDOMEN WITHOUT AND WITH CONTRAST (INCLUDING MRCP) TECHNIQUE: Multiplanar multisequence MR imaging of the abdomen was performed both before and after the administration of intravenous contrast. Heavily T2-weighted images of the biliary and pancreatic ducts were obtained, and three-dimensional MRCP images were rendered by post processing. CONTRAST:  18mL MULTIHANCE GADOBENATE DIMEGLUMINE 529 MG/ML IV SOLN COMPARISON:  None. FINDINGS: Lower chest: Trace left pleural effusion. Hepatobiliary: No suspicious cystic or solid hepatic lesions. MRCP images demonstrate mild intrahepatic and moderate extrahepatic biliary ductal dilatation, with the common bile duct measuring up to 1.4 cm in diameter. There are no filling defects within the common bile duct to suggest retained choledocholithiasis. Status post cholecystectomy. Pancreas: No pancreatic mass. MRCP images demonstrated diffuse pancreatic ductal ectasia and some mild pancreatic ductal dilatation most evident in the head of the pancreas where the duct measures up to 5 mm. However, follow-up axial T2 images obtained 10 hours after the initial images were obtained (to reduce artifact, and in association with repeat postcontrast images) demonstrate no pancreatic ductal dilatation. Subtle enhancement of the ampulla best appreciated on delayed post  gadolinium images (axial image 72 of series 41). Large amount of T2 hyperintense fluid adjacent to the pancreas, concerning for underlying pancreatitis. Spleen:  Unremarkable. Adrenals/Urinary Tract: Bilateral kidneys and bilateral adrenal glands are normal in appearance. No hydroureteronephrosis. Stomach/Bowel: Visualized portions are unremarkable. Vascular/Lymphatic: No aneurysm identified in the visualized abdominal vasculature. No lymphadenopathy noted in the abdomen. Other: Inflammatory changes throughout the retroperitoneum where there is a large amount of peripancreatic and other retroperitoneal fluid. Small volume of ascites. Musculoskeletal: No aggressive appearing osseous lesions are noted in the visualized portions of the skeleton. IMPRESSION: 1. Mild intrahepatic and moderate extrahepatic biliary ductal dilatation. No evidence of retained choledocholithiasis in this post cholecystectomy patient. There is some subtle enhancement of the ampulla. The possibility of an underlying ampullary mass should be considered. Alternatively, these findings could relate to recent passage of a stone. 2. Extensive inflammatory changes around the pancreas indicative of an acute pancreatitis. No definite evidence of pancreatic necrosis or discrete pancreatic pseudocyst identified at this time. 3. Trace left pleural effusion. Electronically Signed   By: Trudie Reedaniel  Entrikin M.D.   On: 10/30/2017 11:51    Assessment/Plan: 31 year old female status post laparoscopic cholecystectomy with cysts patient for retained stone causing pancreatitis.  -Patient to go for ERCP today. -We will continue high rate IV fluid hydration for her pancreatitis and keep her n.p.o. for now.  Discussed with the patient that as her pain improves after the procedure is done then we will start slowly advancing her diet. -No antibiotics needed at this point.   Howie IllJose Luis Nesha Counihan, MD [facility]

## 2017-10-30 NOTE — Anesthesia Post-op Follow-up Note (Signed)
Anesthesia QCDR form completed.        

## 2017-10-30 NOTE — Anesthesia Preprocedure Evaluation (Signed)
Anesthesia Evaluation  Patient identified by MRN, date of birth, ID band Patient awake    Reviewed: Allergy & Precautions, H&P , NPO status , Patient's Chart, lab work & pertinent test results, reviewed documented beta blocker date and time   History of Anesthesia Complications (+) history of anesthetic complications  Airway Mallampati: II   Neck ROM: full    Dental  (+) Teeth Intact   Pulmonary neg pulmonary ROS, asthma ,    Pulmonary exam normal        Cardiovascular negative cardio ROS Normal cardiovascular exam Rhythm:regular Rate:Normal     Neuro/Psych negative neurological ROS  negative psych ROS   GI/Hepatic negative GI ROS, Neg liver ROS, GERD  Medicated,  Endo/Other  negative endocrine ROS  Renal/GU negative Renal ROS  negative genitourinary   Musculoskeletal   Abdominal   Peds  Hematology negative hematology ROS (+) anemia ,   Anesthesia Other Findings Past Medical History: No date: Acute biliary pancreatitis 10/24/2017: Acute cholecystitis 07/14/2011: Allergic rhinitis No date: Asthma No date: Complication of anesthesia     Comment:  hypotension, bradycardia 10/29/2017: Elevated LFTs No date: GERD (gastroesophageal reflux disease) 01/27/2016: S/P cesarean section 10/29/2017: S/P laparoscopic cholecystectomy 10/29/2017: Uterine size-date discrepancy, third trimester Past Surgical History: No date: BREAST REDUCTION SURGERY; Bilateral No date: BREAST SURGERY     Comment:  reduction No date: CESAREAN SECTION     Comment:  x 2 No date: HAND TENDON SURGERY; Right BMI    Body Mass Index:  31.62 kg/m     Reproductive/Obstetrics negative OB ROS                             Anesthesia Physical Anesthesia Plan  ASA: III  Anesthesia Plan: General   Post-op Pain Management:    Induction:   PONV Risk Score and Plan:   Airway Management Planned:   Additional Equipment:    Intra-op Plan:   Post-operative Plan:   Informed Consent: I have reviewed the patients History and Physical, chart, labs and discussed the procedure including the risks, benefits and alternatives for the proposed anesthesia with the patient or authorized representative who has indicated his/her understanding and acceptance.   Dental Advisory Given  Plan Discussed with: CRNA  Anesthesia Plan Comments:         Anesthesia Quick Evaluation

## 2017-10-31 LAB — COMPREHENSIVE METABOLIC PANEL
ALK PHOS: 245 U/L — AB (ref 38–126)
ALT: 226 U/L — AB (ref 14–54)
AST: 62 U/L — AB (ref 15–41)
Albumin: 3 g/dL — ABNORMAL LOW (ref 3.5–5.0)
Anion gap: 4 — ABNORMAL LOW (ref 5–15)
CALCIUM: 8 mg/dL — AB (ref 8.9–10.3)
CHLORIDE: 111 mmol/L (ref 101–111)
CO2: 22 mmol/L (ref 22–32)
CREATININE: 0.5 mg/dL (ref 0.44–1.00)
Glucose, Bld: 139 mg/dL — ABNORMAL HIGH (ref 65–99)
Potassium: 3.8 mmol/L (ref 3.5–5.1)
Sodium: 137 mmol/L (ref 135–145)
Total Bilirubin: 1 mg/dL (ref 0.3–1.2)
Total Protein: 5.8 g/dL — ABNORMAL LOW (ref 6.5–8.1)

## 2017-10-31 LAB — LIPASE, BLOOD: LIPASE: 332 U/L — AB (ref 11–51)

## 2017-10-31 MED ORDER — ALBUTEROL SULFATE (2.5 MG/3ML) 0.083% IN NEBU: 2.5000 mg | INHALATION_SOLUTION | Freq: Four times a day (QID) | RESPIRATORY_TRACT | Status: DC | PRN

## 2017-10-31 NOTE — Progress Notes (Signed)
10/31/2017  Subjective: No acute events.  ERCP yesterday showed sludge in the CBD which was swept.  Pain improved today but still present.  Able to tolerate some liquids yesterday.  Mild nausea.  Vital signs: Temp:  [98.4 F (36.9 C)-100.2 F (37.9 C)] 98.7 F (37.1 C) (11/14 0511) Pulse Rate:  [75-101] 89 (11/14 0511) Resp:  [16-21] 17 (11/14 0511) BP: (116-135)/(76-95) 135/87 (11/14 0511) SpO2:  [95 %-100 %] 95 % (11/14 0511) Weight:  [86.2 kg (190 lb)] 86.2 kg (190 lb) (11/13 1542)   Intake/Output: 11/13 0701 - 11/14 0700 In: 3364 [P.O.:120; I.V.:3244] Out: 1500 [Urine:1500] Last BM Date: 10/28/17  Physical Exam: Constitutional: No acute distress Abdomen:  Soft, nondistended, with tenderness to palpation in upper abdomen.  Incisions clean, dry, intact with steri strips in place.  Labs:  Recent Labs    10/29/17 0637 10/30/17 0449  WBC 8.5 8.0  HGB 12.4 11.1*  HCT 38.3 34.5*  PLT 465* 341   Recent Labs    10/30/17 0449 10/31/17 0511  NA 137 137  K 3.5 3.8  CL 107 111  CO2 22 22  GLUCOSE 90 139*  BUN 5* <5*  CREATININE 0.61 0.50  CALCIUM 8.2* 8.0*   No results for input(s): LABPROT, INR in the last 72 hours.  Imaging: Dg C-arm 1-60 Min-no Report  Result Date: 10/30/2017 Fluoroscopy was utilized by the requesting physician.  No radiographic interpretation.    Assessment/Plan: 31 yo female s/p lap cholecystectomy with retained sludge  --Continue clear liquids today given that she's still having pain, would not advance yet.  Continue IV fluids to keep her well hydrated. --IV pain medications --LFTs and lipase improving today.  No further checks needed at this point.   Howie IllJose Luis Anberlyn Feimster, MD [facility]

## 2017-11-01 LAB — CBC WITH DIFFERENTIAL/PLATELET
BASOS ABS: 0 10*3/uL (ref 0–0.1)
Basophils Relative: 0 %
EOS PCT: 4 %
Eosinophils Absolute: 0.4 10*3/uL (ref 0–0.7)
HEMATOCRIT: 31.3 % — AB (ref 35.0–47.0)
Hemoglobin: 10 g/dL — ABNORMAL LOW (ref 12.0–16.0)
LYMPHS PCT: 19 %
Lymphs Abs: 1.7 10*3/uL (ref 1.0–3.6)
MCH: 26.9 pg (ref 26.0–34.0)
MCHC: 32.1 g/dL (ref 32.0–36.0)
MCV: 83.9 fL (ref 80.0–100.0)
MONO ABS: 0.8 10*3/uL (ref 0.2–0.9)
MONOS PCT: 9 %
NEUTROS ABS: 6.2 10*3/uL (ref 1.4–6.5)
Neutrophils Relative %: 68 %
PLATELETS: 339 10*3/uL (ref 150–440)
RBC: 3.73 MIL/uL — ABNORMAL LOW (ref 3.80–5.20)
RDW: 16 % — AB (ref 11.5–14.5)
WBC: 9 10*3/uL (ref 3.6–11.0)

## 2017-11-01 LAB — COMPREHENSIVE METABOLIC PANEL
ALBUMIN: 3.1 g/dL — AB (ref 3.5–5.0)
ALT: 170 U/L — ABNORMAL HIGH (ref 14–54)
ANION GAP: 5 (ref 5–15)
AST: 28 U/L (ref 15–41)
Alkaline Phosphatase: 226 U/L — ABNORMAL HIGH (ref 38–126)
BILIRUBIN TOTAL: 1 mg/dL (ref 0.3–1.2)
CHLORIDE: 110 mmol/L (ref 101–111)
CO2: 22 mmol/L (ref 22–32)
Calcium: 8.2 mg/dL — ABNORMAL LOW (ref 8.9–10.3)
Creatinine, Ser: 0.51 mg/dL (ref 0.44–1.00)
GFR calc Af Amer: >60 mL/min (ref >60)
GLUCOSE: 93 mg/dL (ref 65–99)
POTASSIUM: 3.5 mmol/L (ref 3.5–5.1)
Sodium: 137 mmol/L (ref 135–145)
TOTAL PROTEIN: 6.3 g/dL — AB (ref 6.5–8.1)

## 2017-11-01 MED ORDER — HYDROCODONE-ACETAMINOPHEN 5-325 MG PO TABS
1.0000 | ORAL_TABLET | ORAL | Status: DC | PRN
  Administered 2017-11-01: 2 via ORAL
  Filled 2017-11-01: qty 2

## 2017-11-01 NOTE — Progress Notes (Signed)
11/01/2017  Subjective: No acute events.  Patient reports that her pain is doing better today.  Yesterday we kept her back to sips due to some nausea but she is feeling better this morning.  Vital signs: Temp:  [99.6 F (37.6 C)-100.1 F (37.8 C)] 99.8 F (37.7 C) (11/15 0528) Pulse Rate:  [79-88] 85 (11/15 0528) Resp:  [18] 18 (11/14 2002) BP: (125-135)/(77-87) 135/87 (11/15 0528) SpO2:  [97 %-98 %] 97 % (11/15 0528)   Intake/Output: 11/14 0701 - 11/15 0700 In: 2821 [P.O.:240; I.V.:2581] Out: 2000 [Urine:2000] Last BM Date: 10/31/17(pr pt mucuous)  Physical Exam: Constitutional: No acute distress Abdomen: Soft, nondistended, with mild tenderness to palpation in the upper abdomen.  No peritoneal signs.  Labs:  Recent Labs    10/30/17 0449 11/01/17 0442  WBC 8.0 9.0  HGB 11.1* 10.0*  HCT 34.5* 31.3*  PLT 341 339   Recent Labs    10/31/17 0511 11/01/17 0442  NA 137 137  K 3.8 3.5  CL 111 110  CO2 22 22  GLUCOSE 139* 93  BUN <5* <5*  CREATININE 0.50 0.51  CALCIUM 8.0* 8.2*   No results for input(s): LABPROT, INR in the last 72 hours.  Imaging: No results found.  Assessment/Plan: [age] year old female status post laparoscopic cholecystectomy with retained sludge that resulted in elevated LFTs and acute pancreatitis.  -We will start the patient on clear liquid diet this morning.  Recommended that she take it slowly in case her pain returns. -We will start transitioning to oral pain medication. -Her laboratory studies were reviewed today everything is normalizing as well.   Howie IllJose Luis Saide Lanuza, MD [facility]

## 2017-11-02 ENCOUNTER — Encounter: Payer: Self-pay | Admitting: Surgery

## 2017-11-02 MED ORDER — ACETAMINOPHEN 325 MG PO TABS
650.0000 mg | ORAL_TABLET | Freq: Four times a day (QID) | ORAL | Status: DC | PRN
  Administered 2017-11-02: 650 mg via ORAL
  Filled 2017-11-02: qty 2

## 2017-11-02 MED ORDER — HYDROCODONE-ACETAMINOPHEN 5-325 MG PO TABS: 1.0000 | ORAL_TABLET | ORAL | 0 refills | Status: AC | PRN

## 2017-11-02 NOTE — Progress Notes (Signed)
Patient cleared for discharge per Dr Aleen CampiPiscoya. IV removed. AVS printed and discharge instructions and prescriptions given. All patient questions answered until patient verbalized understanding. Walked to entrance with family.

## 2017-11-02 NOTE — Discharge Summary (Addendum)
Patient ID: Lucienne MinksBrianne Brunkhorst MRN: [id number] DOB/AGE: [birth year] 31 y.o.  Admit date: 10/29/2017 Discharge date: 11/02/2017   Discharge Diagnoses:  Principal Problem:   Elevated LFTs Active Problems:   S/P laparoscopic cholecystectomy   Common bile duct dilation   Acute biliary pancreatitis   Elevated liver enzymes   Other specified diseases of biliary tract   Procedures:  ERCP  Hospital Course:  Patient was admitted on 10/29/17 s/p recent laparoscopic cholecystectomy, with abdominal pain, and elevated LFTs and lipase.  Was diagnosed with likely retained stone, causing pancreatitis as well.  GI was consulted and ERCP was performed on 11/13, during which sludge was swept off the CBD.  Post-procedure, the patient was having significant pain from her pancreatitis and conservative measures were taken with NPO diet, and aggressive IV fluid hydration.  Her symptoms improved and her diet was slowly advanced.  On 11/16, she was tolerating a regular diet, her pain was well controlled, was ambulating, had bowel function, and was deemed ready for discharge.  On exam, she was in no acute distress with stable vital signs.  Her abdomen was soft, non-distended, with much improved tenderness to palpation.  Her incisions were clean, dry, intact with steri strips in place.  Consults: Gastroenterology  Disposition: 01-Home or Self Care  Discharge Instructions    Call MD for:  difficulty breathing, headache or visual disturbances   Complete by:  As directed    Call MD for:  persistant nausea and vomiting   Complete by:  As directed    Call MD for:  redness, tenderness, or signs of infection (pain, swelling, redness, odor or green/yellow discharge around incision site)   Complete by:  As directed    Call MD for:  severe uncontrolled pain   Complete by:  As directed    Call MD for:  temperature >100.4   Complete by:  As directed    Diet - low sodium heart healthy   Complete by:  As directed     Discharge instructions   Complete by:  As directed    1.  Patient may shower, but do not scrub wounds heavily and dab dry only. 2.  Patient may now submerge wounds in pool/tub 3.  Allow steri strips to fall off on their own.   Driving Restrictions   Complete by:  As directed    Do not drive while taking narcotics for pain control.   Increase activity slowly   Complete by:  As directed    Lifting restrictions   Complete by:  As directed    No heavy lifting or pushing of more than 10-15 lbs for 4 weeks.   No dressing needed   Complete by:  As directed      Allergies as of 11/02/2017      Reactions   Percocet [oxycodone-acetaminophen] Nausea And Vomiting      Medication List    TAKE these medications   albuterol 108 (90 Base) MCG/ACT inhaler Commonly known as:  PROVENTIL HFA;VENTOLIN HFA Inhale 2 puffs into the lungs every 6 (six) hours as needed for wheezing or shortness of breath.   Biotin 1 MG Caps Take by mouth.   docusate sodium 100 MG capsule Commonly known as:  COLACE Take 1 capsule (100 mg total) by mouth daily as needed.   Fluticasone-Salmeterol 250-50 MCG/DOSE Aepb Commonly known as:  ADVAIR Inhale 1 puff into the lungs 2 (two) times daily.   HYDROcodone-acetaminophen 5-325 MG tablet Commonly known as:  NORCO/VICODIN Take 1-2 tablets  every 4 (four) hours as needed by mouth for moderate pain.   ibuprofen 600 MG tablet Commonly known as:  ADVIL,MOTRIN Take 1 tablet (600 mg total) by mouth every 6 (six) hours as needed for mild pain.   magnesium oxide 400 MG tablet Commonly known as:  MAG-OX Take by mouth.   MAGNESIUM PO Take 1 tablet daily by mouth.   omeprazole 20 MG capsule Commonly known as:  PRILOSEC Take daily by mouth.   Potassium 99 MG Tabs Take 1 tablet daily by mouth.   Potassium Chloride CR 8 MEQ Cpcr capsule CR Commonly known as:  MICRO-K Take by mouth.   traMADol 50 MG tablet Commonly known as:  ULTRAM Take 1 tablet (50 mg total)  every 6 (six) hours as needed by mouth for moderate pain.      Follow-up Information    Lattie Hawooper, Richard E, MD Follow up in 2 week(s).   Specialty:  Surgery Contact information: [address] [place] [address] [phone]

## 2017-11-02 NOTE — Progress Notes (Signed)
Pt refused cont fluid and am scheduled toradol. Dr.woodham notified. acknowledged

## 2017-11-07 NOTE — Anesthesia Postprocedure Evaluation (Signed)
Anesthesia Post Note  Patient: Amber Page  Procedure(s) Performed: ENDOSCOPIC RETROGRADE CHOLANGIOPANCREATOGRAPHY (ERCP) WITH PROPOFOL (N/A )  Patient location during evaluation: PACU Anesthesia Type: General Level of consciousness: awake and alert Pain management: pain level controlled Vital Signs Assessment: post-procedure vital signs reviewed and stable Respiratory status: spontaneous breathing, nonlabored ventilation, respiratory function stable and patient connected to nasal cannula oxygen Cardiovascular status: blood pressure returned to baseline and stable Postop Assessment: no apparent nausea or vomiting Anesthetic complications: no     Last Vitals:  Vitals:   11/02/17 0411 11/02/17 1125  BP: 123/75 (!) 135/94  Pulse: 69 82  Resp: 19   Temp: 36.6 C 36.9 C  SpO2: 98% 99%    Last Pain:  Vitals:   11/02/17 1125  TempSrc: Oral  PainSc: 5                  Yevette EdwardsJames G Adams

## 2017-11-16 ENCOUNTER — Telehealth: Payer: Self-pay | Admitting: General Practice

## 2017-11-16 NOTE — Telephone Encounter (Signed)
Patient was schedule for Monday for an appointment, but called and cancelled due to being in a meeting at work, when asked to r/s, patient asked if it was necessary to come in, patient said she was feeling a lot better. Please call patient and advice.

## 2017-11-19 ENCOUNTER — Inpatient Hospital Stay: Payer: Self-pay | Admitting: Surgery

## 2017-11-19 ENCOUNTER — Telehealth: Payer: Self-pay

## 2017-11-19 NOTE — Telephone Encounter (Signed)
Left message at this time for patient to return call regarding post op appointment.  Patient stated she was feeling fine and didn't see the need to reschedule at this time.

## 2017-11-19 NOTE — Telephone Encounter (Signed)
Patient returned call at this time. I advised her that we prefer her to come back in for a post-op visit to check the incisions and her over all recovery however if she declines that I will have to document it. Patient verbalized to me that she dose not want to come in for a post-op visit and that she is feeling fine and thinks the incisions are healing well. She understood that I would have to document her declining the visit and verbalized understanding.

## 2018-08-02 IMAGING — US US ABDOMEN LIMITED
1 series · 14 of 25 positions shown · non-contrast
Comparison: None.

CLINICAL DATA: Initial evaluation for acute right upper quadrant
postprandial pain with nausea.

EXAM:
ULTRASOUND ABDOMEN LIMITED RIGHT UPPER QUADRANT

[Series 1: us abdomen limited · 0.22mm/px · 14 of 61 slices shown]
[im 1/61]
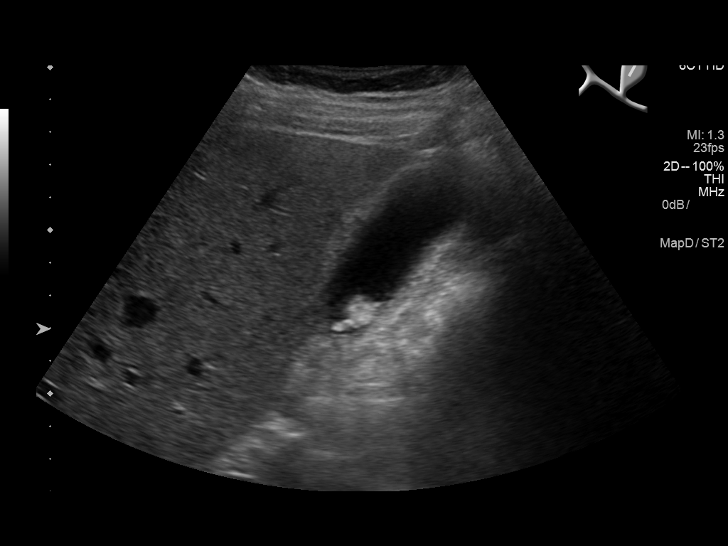
[im 6/61]
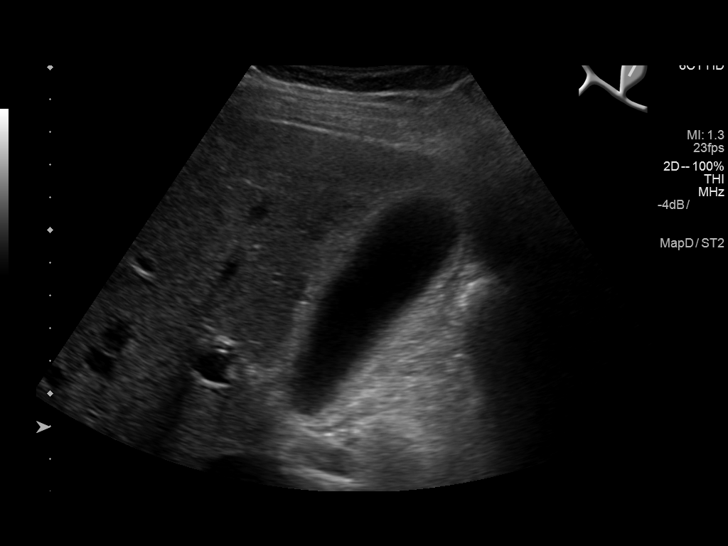
[im 11/61]
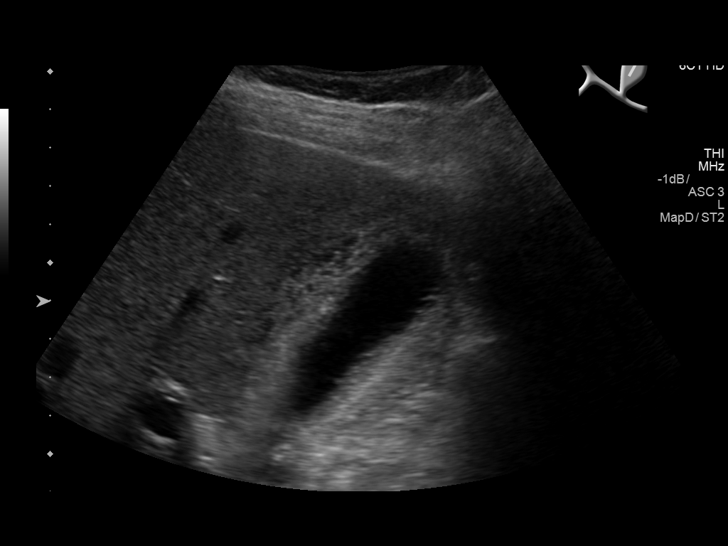
[im 16/61]
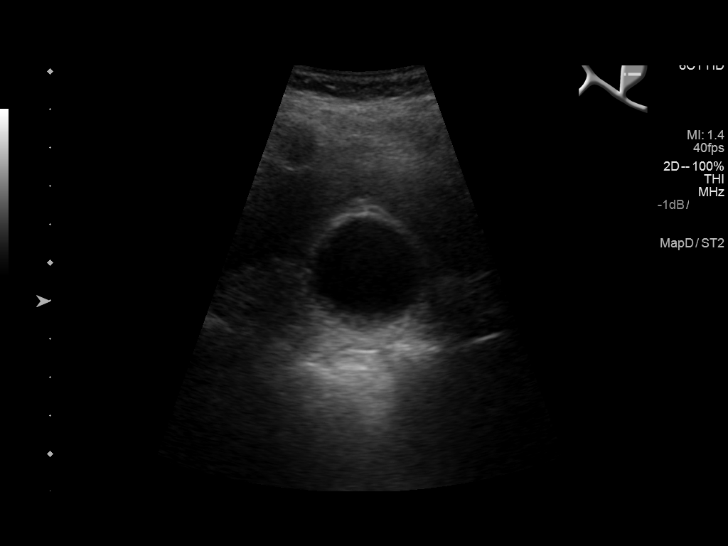
[im 21/61]
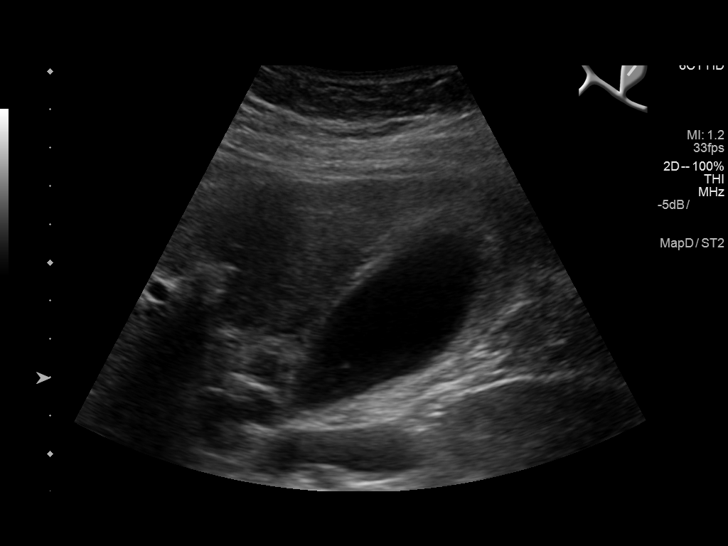
[im 23/61]
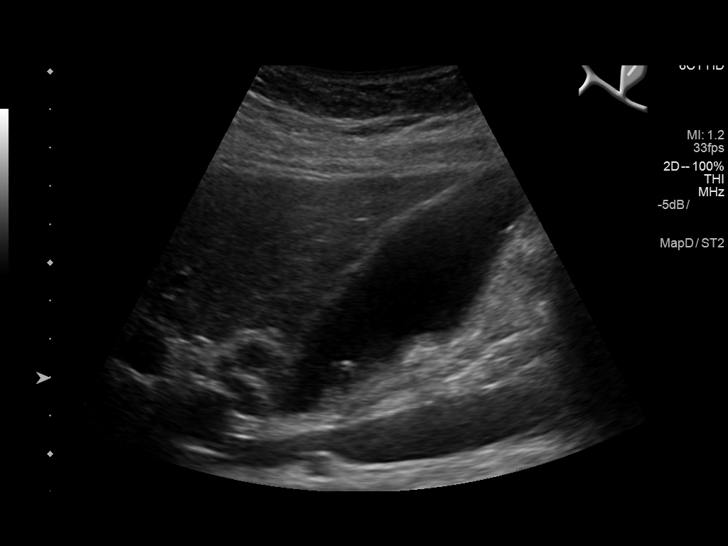
[im 28/61]
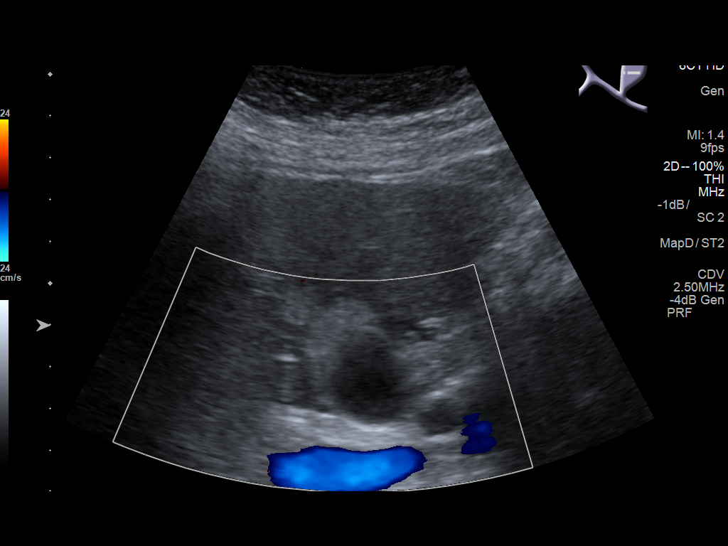
[im 33/61]
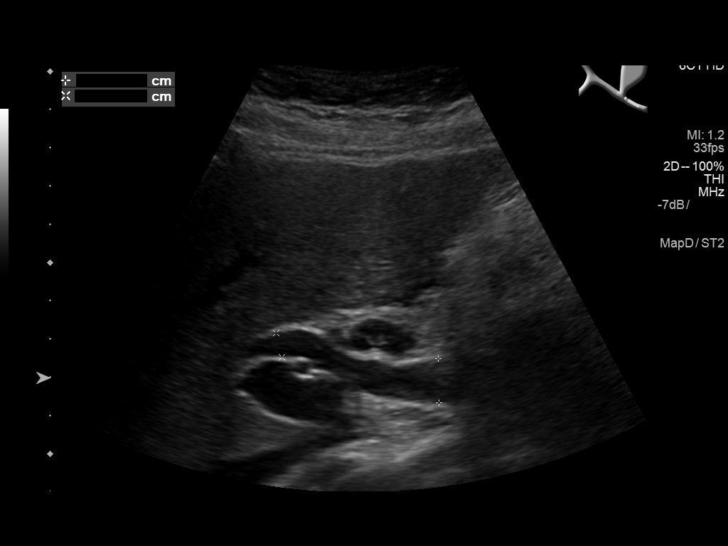
[im 38/61]
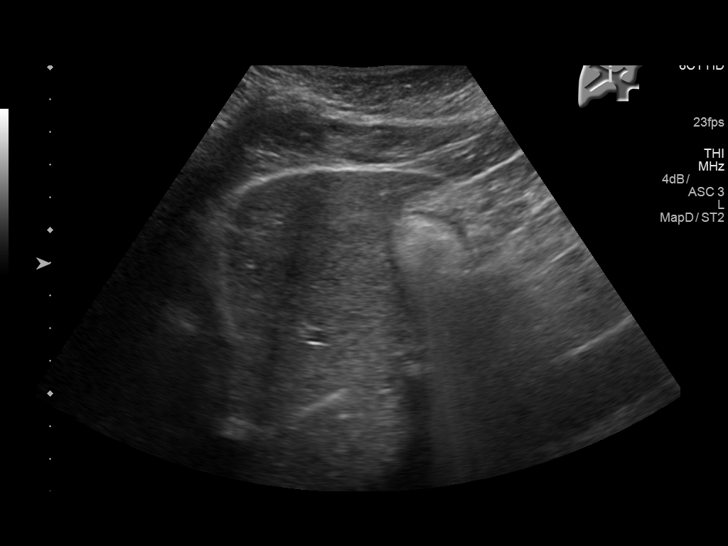
[im 41/61]
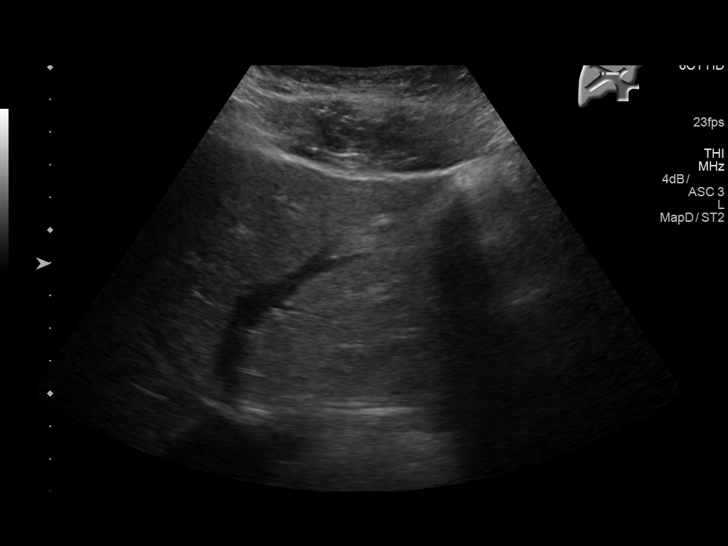
[im 46/61]
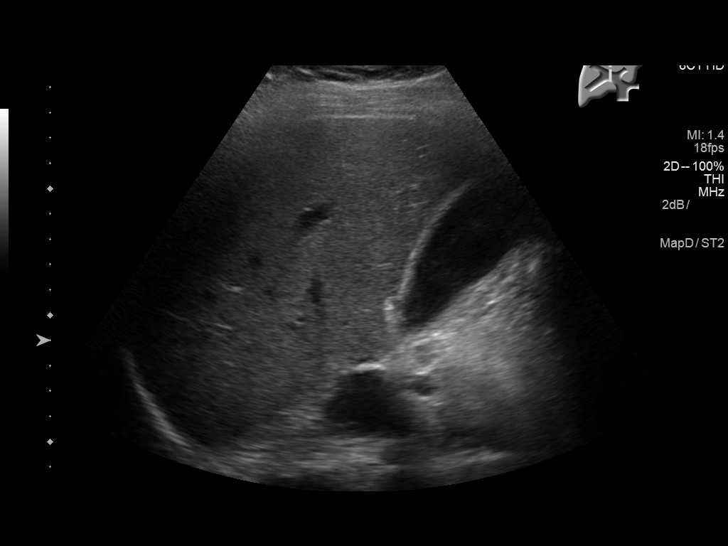
[im 51/61]
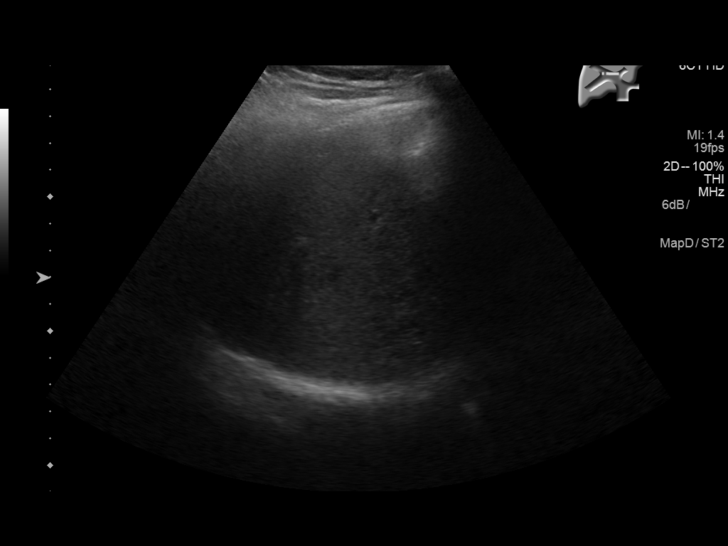
[im 56/61]
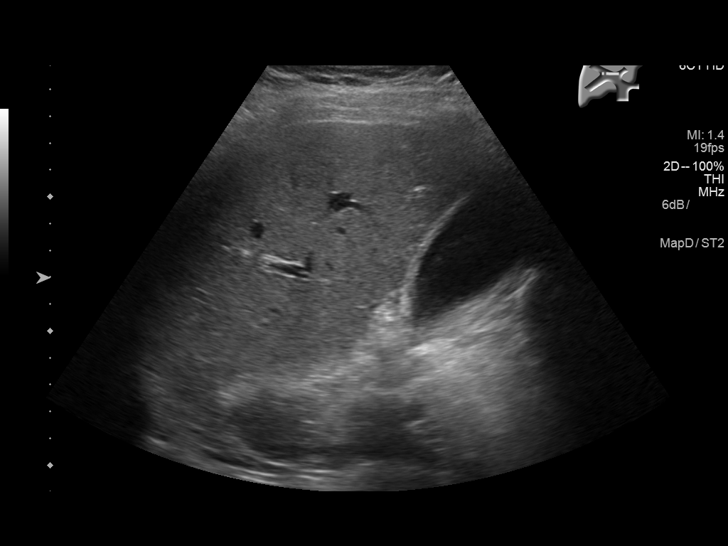
[im 61/61]
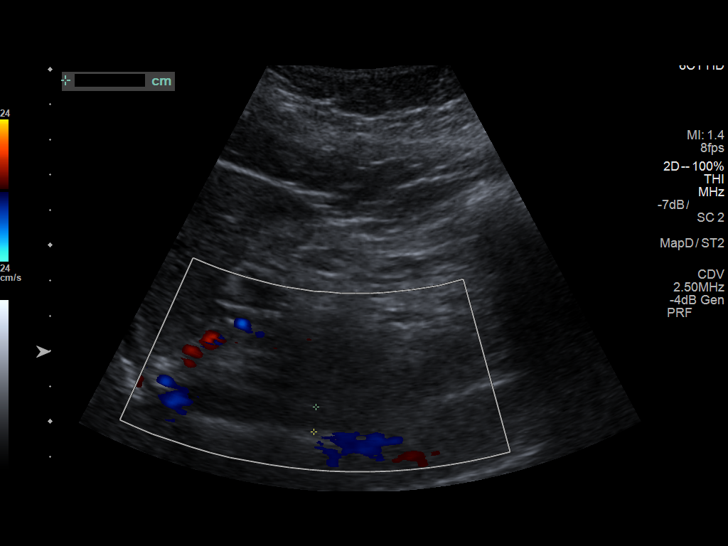

[14 of 25 positions shown; findings below may reference images not displayed]

FINDINGS: Gallbladder:

Multiple shadowing echogenic stones present within the gallbladder
wall lumen, largest of which measures approximately 1 cm.
Gallbladder wall thickened up to 17 mm. Trace free pericholecystic
fluid. Positive sonographic Murphy sign was elicited on exam.

Common bile duct:

Diameter: 11.5 mm

Liver:

No focal lesion identified. Within normal limits in parenchymal
echogenicity. Portal vein is patent on color Doppler imaging with
normal direction of blood flow towards the liver.
IMPRESSION: 1. Cholelithiasis with gallbladder wall thickening and positive
sonographic Murphy sign. Clinical correlation for possible acute
cholecystitis recommended.
2. Dilatation of the common bile duct up to 11.5 mm. No
choledocholithiasis identified.

## 2019-09-13 ENCOUNTER — Emergency Department
Admission: EM | Admit: 2019-09-13 | Discharge: 2019-09-13 | Disposition: A | Discharge status: Home / Self Care | Payer: BC Managed Care – PPO | Attending: Emergency Medicine | Admitting: Emergency Medicine

## 2019-09-13 ENCOUNTER — Other Ambulatory Visit: Payer: Self-pay

## 2019-09-13 ENCOUNTER — Encounter: Payer: Self-pay | Admitting: Emergency Medicine

## 2019-09-13 ENCOUNTER — Emergency Department: Payer: BC Managed Care – PPO

## 2019-09-13 DIAGNOSIS — R0789 Other chest pain: Secondary | ICD-10-CM

## 2019-09-13 DIAGNOSIS — J45909 Unspecified asthma, uncomplicated: Secondary | ICD-10-CM | POA: Diagnosis not present

## 2019-09-13 DIAGNOSIS — Z79899 Other long term (current) drug therapy: Secondary | ICD-10-CM | POA: Insufficient documentation

## 2019-09-13 LAB — BASIC METABOLIC PANEL
Anion gap: 7 (ref 5–15)
BUN: 10 mg/dL (ref 6–20)
CO2: 25 mmol/L (ref 22–32)
Calcium: 9.1 mg/dL (ref 8.9–10.3)
Chloride: 107 mmol/L (ref 98–111)
Creatinine, Ser: 0.67 mg/dL (ref 0.44–1.00)
GFR calc Af Amer: >60 mL/min (ref >60)
GFR calc non Af Amer: >60 mL/min (ref >60)
Glucose, Bld: 87 mg/dL (ref 70–99)
Potassium: 3.7 mmol/L (ref 3.5–5.1)
Sodium: 139 mmol/L (ref 135–145)

## 2019-09-13 LAB — CBC
HCT: 38.8 % (ref 36.0–46.0)
Hemoglobin: 12.8 g/dL (ref 12.0–15.0)
MCH: 29 pg (ref 26.0–34.0)
MCHC: 33 g/dL (ref 30.0–36.0)
MCV: 88 fL (ref 80.0–100.0)
Platelets: 408 10*3/uL — ABNORMAL HIGH (ref 150–400)
RBC: 4.41 MIL/uL (ref 3.87–5.11)
RDW: 12.8 % (ref 11.5–15.5)
WBC: 9 10*3/uL (ref 4.0–10.5)
nRBC: 0 % (ref 0.0–0.2)

## 2019-09-13 LAB — HEPATIC FUNCTION PANEL
ALT: 28 U/L (ref 0–44)
AST: 21 U/L (ref 15–41)
Albumin: 4.1 g/dL (ref 3.5–5.0)
Alkaline Phosphatase: 74 U/L (ref 38–126)
Bilirubin, Direct: <0.1 mg/dL (ref 0.0–0.2)
Total Bilirubin: 0.3 mg/dL (ref 0.3–1.2)
Total Protein: 7.5 g/dL (ref 6.5–8.1)

## 2019-09-13 LAB — LIPASE, BLOOD: Lipase: 43 U/L (ref 11–51)

## 2019-09-13 LAB — TROPONIN I (HIGH SENSITIVITY): Troponin I (High Sensitivity): <2 ng/L (ref <18)

## 2019-09-13 LAB — FIBRIN DERIVATIVES D-DIMER (ARMC ONLY): Fibrin derivatives D-dimer (ARMC): 292.55 ng/mL (FEU) (ref 0.00–499.00)

## 2019-09-13 MED ORDER — ALUM & MAG HYDROXIDE-SIMETH 200-200-20 MG/5ML PO SUSP
30.0000 mL | Freq: Once | ORAL | Status: AC
  Administered 2019-09-13: 30 mL via ORAL
  Filled 2019-09-13: qty 30

## 2019-09-13 MED ORDER — LORAZEPAM 2 MG/ML IJ SOLN
1.0000 mg | Freq: Once | INTRAMUSCULAR | Status: AC
  Administered 2019-09-13: 1 mg via INTRAVENOUS
  Filled 2019-09-13: qty 1

## 2019-09-13 MED ORDER — HYDROXYZINE HCL 25 MG PO TABS: 25.0000 mg | ORAL_TABLET | Freq: Three times a day (TID) | ORAL | 0 refills | Status: AC | PRN

## 2019-09-13 MED ORDER — LIDOCAINE VISCOUS HCL 2 % MT SOLN
15.0000 mL | Freq: Once | OROMUCOSAL | Status: AC
  Administered 2019-09-13: 15 mL via ORAL
  Filled 2019-09-13: qty 15

## 2019-09-13 NOTE — ED Notes (Signed)
Peripheral IV discontinued. Catheter intact. No signs of infiltration or redness. Gauze applied to IV site.   Discharge instructions reviewed with patient. Questions fielded by this RN. Patient verbalizes understanding of instructions. Patient discharged home in stable condition per [place]. No acute distress noted at time of discharge.   

## 2019-09-13 NOTE — ED Notes (Addendum)
ED Provider at bedside.  Pt reports 2 pains sharp in the back the started last night, and now heaviness in the chest that started while pt was just sitting still with children (pt has has 4 kids within 5 years), radiating to both arms tingling, pt denies hx of anxiety attack, use of tobacco or birth control  Pt calm and sitting in the bed att

## 2019-09-13 NOTE — ED Provider Notes (Signed)
[facility] Emergency Department Provider Note  ____________________________________________   First MD Initiated Contact with Patient 09/13/19 1839     (approximate)  I have reviewed the triage vital signs and the nursing notes.   HISTORY  Chief Complaint Chest Pain    HPI Amber Page is a 33 y.o. female with past medical history as below here with atypical chest pain.  The patient states that her symptoms started approximately 7 hours ago.  She states it started as a fairly sharp pain in her right scapular area.  Then developed more so into an anterior chest pressure in her substernal area.  She had associated anxiety and tingling in her bilateral upper extremities.  She does admit to being under significant recent stressors.  She has a history of anxiety but is not take anything for it.  She has a family history of heart disease but no personal history of heart disease.  She does not smoke.  Denies any leg swelling or recent immobilization.  No history of PE.  She is not on OCPs other estrogen supplementation.  The pain is a fairly constant, aching, substernal pressure that has not changed since it started.  No alleviating factors.  No other complaints.        Past Medical History:  Diagnosis Date   Acute biliary pancreatitis    Acute cholecystitis 10/24/2017   Allergic rhinitis 07/14/2011   Asthma    Complication of anesthesia    hypotension, bradycardia   Elevated LFTs 10/29/2017   GERD (gastroesophageal reflux disease)    S/P cesarean section 01/27/2016   S/P laparoscopic cholecystectomy 10/29/2017   Uterine size-date discrepancy, third trimester 10/29/2017    Patient Active Problem List   Diagnosis Date Noted   Elevated liver enzymes    Other specified diseases of biliary tract    S/P laparoscopic cholecystectomy 10/29/2017   Elevated LFTs 10/29/2017   Uterine size-date discrepancy, third trimester 10/29/2017   Common bile  duct dilation    Acute biliary pancreatitis    Acute cholecystitis 10/24/2017   Labor and delivery indication for care or intervention 05/08/2017   Anemia affecting pregnancy in third trimester 04/16/2017   Obesity in pregnancy, antepartum 11/16/2016   S/P cesarean section 01/27/2016   Postpartum care following cesarean delivery 01/27/2016   Asthma 01/27/2016   Allergic rhinitis 07/14/2011   Prenatal consult 07/14/2011    Past Surgical History:  Procedure Laterality Date   BREAST REDUCTION SURGERY Bilateral    BREAST SURGERY     reduction   CESAREAN SECTION     x 2   CESAREAN SECTION N/A 01/27/2016   Procedure: REPEAT CESAREAN SECTION;  Surgeon: Elenora Fender Ward, MD;  Location: [facility] ORS;  Service: Obstetrics;  Laterality: N/A;   CESAREAN SECTION WITH BILATERAL TUBAL LIGATION Bilateral 05/09/2017   Procedure: CESAREAN SECTION WITH BILATERAL TUBAL LIGATION;  Surgeon: Ward, Elenora Fender, MD;  Location: [facility] ORS;  Service: Obstetrics;  Laterality: Bilateral;   CHOLECYSTECTOMY N/A 10/25/2017   Procedure: LAPAROSCOPIC CHOLECYSTECTOMY;  Surgeon: Lattie Haw, MD;  Location: [facility] ORS;  Service: General;  Laterality: N/A;   HAND TENDON SURGERY Right     Prior to Admission medications   Medication Sig Start Date End Date Taking? Authorizing Provider  albuterol (PROVENTIL HFA;VENTOLIN HFA) 108 (90 Base) MCG/ACT inhaler Inhale 2 puffs into the lungs every 6 (six) hours as needed for wheezing or shortness of breath.    [provider]  Biotin 1 MG CAPS Take by mouth.  [provider]  Fluticasone-Salmeterol (ADVAIR) 250-50 MCG/DOSE AEPB Inhale 1 puff into the lungs 2 (two) times daily.    [provider]  HYDROcodone-acetaminophen (NORCO/VICODIN) 5-325 MG tablet Take 1-2 tablets every 4 (four) hours as needed by mouth for moderate pain. 11/02/17   Olean Ree, MD  hydrOXYzine (ATARAX/VISTARIL) 25 MG tablet Take 1 tablet (25 mg total) by mouth every 8  (eight) hours as needed for anxiety. 09/13/19   Duffy Bruce, MD  ibuprofen (ADVIL,MOTRIN) 600 MG tablet Take 1 tablet (600 mg total) by mouth every 6 (six) hours as needed for mild pain. Patient not taking: Reported on 10/24/2017 05/11/17   Schermerhorn, Gwen Her, MD  magnesium oxide (MAG-OX) 400 MG tablet Take by mouth.    [provider]  MAGNESIUM PO Take 1 tablet daily by mouth.    [provider]  omeprazole (PRILOSEC) 20 MG capsule Take daily by mouth. 09/17/17   [provider]  Potassium 99 MG TABS Take 1 tablet daily by mouth.    [provider]  Potassium Chloride CR (MICRO-K) 8 MEQ CPCR capsule CR Take by mouth.    [provider]  traMADol (ULTRAM) 50 MG tablet Take 1 tablet (50 mg total) every 6 (six) hours as needed by mouth for moderate pain. 10/26/17   Florene Glen, MD    Allergies Percocet [oxycodone-acetaminophen]  Family History  Problem Relation Age of Onset   Hypertension Mother     Social History Social History   Tobacco Use   Smoking status: Never Smoker   Smokeless tobacco: Never Used  Substance Use Topics   Alcohol use: No   Drug use: No    Review of Systems  Review of Systems  Constitutional: Positive for fatigue. Negative for fever.  HENT: Negative for congestion and sore throat.   Eyes: Negative for visual disturbance.  Respiratory: Positive for chest tightness. Negative for cough and shortness of breath.   Cardiovascular: Positive for chest pain.  Gastrointestinal: Negative for abdominal pain, diarrhea, nausea and vomiting.  Genitourinary: Negative for flank pain.  Musculoskeletal: Positive for arthralgias. Negative for back pain and neck pain.  Skin: Negative for rash and wound.  Neurological: Negative for weakness.     ____________________________________________  PHYSICAL EXAM:      VITAL SIGNS: ED Triage Vitals [09/13/19 1830]  Enc Vitals Group     BP 129/86     Pulse Rate 100      Resp 20     Temp 98.3 F (36.8 C)     Temp Source Oral     SpO2 99 %     Weight 225 lb (102.1 kg)     Height 5\' 5"  (1.651 m)     Head Circumference      Peak Flow      Pain Score 7     Pain Loc      Pain Edu?      Excl. in [place]?      Physical Exam Vitals signs and nursing note reviewed.  Constitutional:      General: She is not in acute distress.    Appearance: She is well-developed.  HENT:     Head: Normocephalic and atraumatic.  Eyes:     Conjunctiva/sclera: Conjunctivae normal.  Neck:     Musculoskeletal: Neck supple.  Cardiovascular:     Rate and Rhythm: Normal rate and regular rhythm.     Heart sounds: Normal heart sounds. No murmur. No friction rub.  Pulmonary:  Effort: Pulmonary effort is normal. No respiratory distress.     Breath sounds: Normal breath sounds. No wheezing or rales.  Abdominal:     General: There is no distension.     Palpations: Abdomen is soft.     Tenderness: There is no abdominal tenderness.  Skin:    General: Skin is warm.     Capillary Refill: Capillary refill takes less than 2 seconds.  Neurological:     Mental Status: She is alert and oriented to person, place, and time.     Motor: No abnormal muscle tone.       ____________________________________________   LABS (all labs ordered are listed, but only abnormal results are displayed)  Labs Reviewed  CBC - Abnormal; Notable for the following components:      Result Value   Platelets 408 (*)    All other components within normal limits  BASIC METABOLIC PANEL  FIBRIN DERIVATIVES D-DIMER ([facility] ONLY)  HEPATIC FUNCTION PANEL  LIPASE, BLOOD  POC URINE PREG, ED  TROPONIN I (HIGH SENSITIVITY)  TROPONIN I (HIGH SENSITIVITY)    ____________________________________________  EKG:  Sinus tachycardia, ventricular rate 101.  PR 142, QRS 80, QTc 443.  No acute ST or T-segment elevation or depressions.  No acute ischemia or  infarct. ________________________________________  RADIOLOGY All imaging, including plain films, CT scans, and ultrasounds, independently reviewed by me, and interpretations confirmed via formal radiology reads.  ED MD interpretation:   Chest x-ray neg  Official radiology report(s): Dg Chest 2 View  Result Date: 09/13/2019 CLINICAL DATA:  Chest pain today. EXAM: CHEST - 2 VIEW COMPARISON:  01/27/2016 FINDINGS: The cardiomediastinal contours are normal. The lungs are clear. Pulmonary vasculature is normal. No consolidation, pleural effusion, or pneumothorax. No acute osseous abnormalities are seen. IMPRESSION: Unremarkable radiographs of the chest. Electronically Signed   By: Narda RutherfordMelanie  Sanford M.D.   On: 09/13/2019 19:20    ____________________________________________  PROCEDURES   Procedure(s) performed (including Critical Care):  Procedures  ____________________________________________  INITIAL IMPRESSION / MDM / ASSESSMENT AND PLAN / ED COURSE  As part of my medical decision making, I reviewed the following data within the electronic MEDICAL RECORD NUMBER Notes from prior ED visits and [place] Controlled Substance Database      *Mikalyn Jenne PaneBates was evaluated in Emergency Department on 09/13/2019 for the symptoms described in the history of present illness. She was evaluated in the context of the global COVID-19 pandemic, which necessitated consideration that the patient might be at risk for infection with the SARS-CoV-2 virus that causes COVID-19. Institutional protocols and algorithms that pertain to the evaluation of patients at risk for COVID-19 are in a state of rapid change based on information released by regulatory bodies including the CDC and federal and state organizations. These policies and algorithms were followed during the patient's care in the ED.  Some ED evaluations and interventions may be delayed as a result of limited staffing during the pandemic.*      Medical Decision  Making: 33 year old female here with atypical chest pain.  Correlation with increased stressors and tingling in her extremities is most consistent with possible viral illness.  Troponin is undetectable despite constant symptoms greater than 6 hours with nonischemic EKG and have a very low concern for ACS.  D-dimer negative, and I do not suspect PE or dissection.  Chest x-ray is clear with no evidence of pneumonia or pneumothorax.  She feels better after Ativan here.  Will discharge with outpatient treatment and follow-up. No arrhythmia on  tele here.  ____________________________________________  FINAL CLINICAL IMPRESSION(S) / ED DIAGNOSES  Final diagnoses:  Atypical chest pain     MEDICATIONS GIVEN DURING THIS VISIT:  Medications  LORazepam (ATIVAN) injection 1 mg (1 mg Intravenous Given 09/13/19 1955)  alum & mag hydroxide-simeth (MAALOX/MYLANTA) 200-200-20 MG/5ML suspension 30 mL (30 mLs Oral Given 09/13/19 1954)    And  lidocaine (XYLOCAINE) 2 % viscous mouth solution 15 mL (15 mLs Oral Given 09/13/19 1954)     ED Discharge Orders         Ordered    hydrOXYzine (ATARAX/VISTARIL) 25 MG tablet  Every 8 hours PRN     09/13/19 2110           Note:  This document was prepared using Dragon voice recognition software and may include unintentional dictation errors.   Shaune Pollack, MD 09/13/19 2113

## 2019-09-13 NOTE — ED Notes (Signed)
Patient transported to X-ray 

## 2019-09-13 NOTE — ED Triage Notes (Signed)
Patient presents to the ED with chest pain x 7 hours.  Patient states pain is in right side of her chest and radiates into her back/shoulder blades.  Patient reports feeling numbness/tingling down both arms.  Patient states she feels like her heart is beating quickly and she feels anxious like she is going to have a panic attack.  Patient reports pain between her shoulder blades on Wednesday that felt similar but did not have chest pain and resolved on it's own.  Patient denies history of panic attacks.  States she was prescribed medication for anxiety a couple of months ago but did not get the prescription filled.

## 2019-11-12 ENCOUNTER — Ambulatory Visit: Payer: BC Managed Care – PPO | Admitting: Family Medicine

## 2019-11-18 ENCOUNTER — Other Ambulatory Visit: Payer: Self-pay

## 2019-11-18 DIAGNOSIS — Z20822 Contact with and (suspected) exposure to covid-19: Secondary | ICD-10-CM

## 2019-11-20 LAB — NOVEL CORONAVIRUS, NAA: SARS-CoV-2, NAA: NOT DETECTED

## 2019-12-01 ENCOUNTER — Ambulatory Visit: Payer: BC Managed Care – PPO | Admitting: Family Medicine

## 2021-05-17 ENCOUNTER — Ambulatory Visit
Admission: RE | Admit: 2021-05-17 | Discharge: 2021-05-17 | Disposition: A | Discharge status: Home / Self Care | Payer: BC Managed Care – PPO | Source: Ambulatory Visit | Attending: Family Medicine | Admitting: Family Medicine

## 2021-05-17 ENCOUNTER — Other Ambulatory Visit: Payer: Self-pay

## 2021-05-17 VITALS — BP 144/92 | HR 101 | Temp 98.3°F | Resp 18

## 2021-05-17 DIAGNOSIS — L02211 Cutaneous abscess of abdominal wall: Secondary | ICD-10-CM

## 2021-05-17 MED ORDER — DOXYCYCLINE HYCLATE 100 MG PO CAPS: 100.0000 mg | ORAL_CAPSULE | Freq: Two times a day (BID) | ORAL | 0 refills | Status: AC

## 2021-05-17 NOTE — Discharge Instructions (Signed)
I have sent in doxycycline for you to take one tablet twice a day for 10 days  Follow up with this office or with primary care if symptoms are persisting.  Follow up in the ER for high fever, trouble swallowing, trouble breathing, other concerning symptoms.  

## 2021-05-17 NOTE — ED Triage Notes (Signed)
Abscess to LT lower abd area

## 2021-05-17 NOTE — ED Provider Notes (Signed)
[facility]    CSN: [id number] Arrival date & time: 05/17/21  1255      History   Chief Complaint Chief Complaint  Patient presents with  . Abscess    HPI Amber Page is a 35 y.o. female.   Reports that she noticed a bump that was bleeding to her low abdomen.  States that it is near her C-section scar, so she cannot really feel it.  States that she still has numbness from when she had a C-section in this area.  Reports that the area was draining blood as well as clear fluid.  Denies previous symptoms.  Has not attempted to treat at home.  Denies headache, cough, chills, body aches, fever, other symptoms.  ROS per HPI  The history is provided by the patient.  Abscess   Past Medical History:  Diagnosis Date  . Acute biliary pancreatitis   . Acute cholecystitis 10/24/2017  . Allergic rhinitis 07/14/2011  . Asthma   . Complication of anesthesia    hypotension, bradycardia  . Elevated LFTs 10/29/2017  . GERD (gastroesophageal reflux disease)   . S/P cesarean section 01/27/2016  . S/P laparoscopic cholecystectomy 10/29/2017  . Uterine size-date discrepancy, third trimester 10/29/2017    Patient Active Problem List   Diagnosis Date Noted  . Elevated liver enzymes   . Other specified diseases of biliary tract   . S/P laparoscopic cholecystectomy 10/29/2017  . Elevated LFTs 10/29/2017  . Uterine size-date discrepancy, third trimester 10/29/2017  . Common bile duct dilation   . Acute biliary pancreatitis   . Acute cholecystitis 10/24/2017  . Labor and delivery indication for care or intervention 05/08/2017  . Anemia affecting pregnancy in third trimester 04/16/2017  . Obesity in pregnancy, antepartum 11/16/2016  . S/P cesarean section 01/27/2016  . Postpartum care following cesarean delivery 01/27/2016  . Asthma 01/27/2016  . Allergic rhinitis 07/14/2011  . Prenatal consult 07/14/2011    Past Surgical History:  Procedure Laterality Date  . BREAST  REDUCTION SURGERY Bilateral   . BREAST SURGERY     reduction  . CESAREAN SECTION     x 2  . CESAREAN SECTION N/A 01/27/2016   Procedure: REPEAT CESAREAN SECTION;  Surgeon: Elenora Fender Ward, MD;  Location: [facility] ORS;  Service: Obstetrics;  Laterality: N/A;  . CESAREAN SECTION WITH BILATERAL TUBAL LIGATION Bilateral 05/09/2017   Procedure: CESAREAN SECTION WITH BILATERAL TUBAL LIGATION;  Surgeon: Ward, Elenora Fender, MD;  Location: [facility] ORS;  Service: Obstetrics;  Laterality: Bilateral;  . CHOLECYSTECTOMY N/A 10/25/2017   Procedure: LAPAROSCOPIC CHOLECYSTECTOMY;  Surgeon: Lattie Haw, MD;  Location: [facility] ORS;  Service: General;  Laterality: N/A;  . HAND TENDON SURGERY Right     OB History    Gravida  4   Para  4   Term  4   Preterm  0   AB  0   Living  4     SAB  0   IAB  0   Ectopic  0   Multiple  0   Live Births  4            Home Medications    Prior to Admission medications   Medication Sig Start Date End Date Taking? Authorizing Provider  doxycycline (VIBRAMYCIN) 100 MG capsule Take 1 capsule (100 mg total) by mouth 2 (two) times daily. 05/17/21  Yes Moshe Cipro, NP  albuterol (PROVENTIL HFA;VENTOLIN HFA) 108 (90 Base) MCG/ACT inhaler Inhale 2 puffs into the lungs every 6 (six)  hours as needed for wheezing or shortness of breath.    [provider]  Biotin 1 MG CAPS Take by mouth.    [provider]  Fluticasone-Salmeterol (ADVAIR) 250-50 MCG/DOSE AEPB Inhale 1 puff into the lungs 2 (two) times daily.    [provider]  HYDROcodone-acetaminophen (NORCO/VICODIN) 5-325 MG tablet Take 1-2 tablets every 4 (four) hours as needed by mouth for moderate pain. 11/02/17   Henrene Dodge, MD  hydrOXYzine (ATARAX/VISTARIL) 25 MG tablet Take 1 tablet (25 mg total) by mouth every 8 (eight) hours as needed for anxiety. 09/13/19   Shaune Pollack, MD  ibuprofen (ADVIL,MOTRIN) 600 MG tablet Take 1 tablet (600 mg total) by mouth every 6 (six) hours  as needed for mild pain. Patient not taking: Reported on 10/24/2017 05/11/17   Schermerhorn, Ihor Austin, MD  magnesium oxide (MAG-OX) 400 MG tablet Take by mouth.    [provider]  MAGNESIUM PO Take 1 tablet daily by mouth.    [provider]  omeprazole (PRILOSEC) 20 MG capsule Take daily by mouth. 09/17/17   [provider]  Potassium 99 MG TABS Take 1 tablet daily by mouth.    [provider]  Potassium Chloride CR (MICRO-K) 8 MEQ CPCR capsule CR Take by mouth.    [provider]  traMADol (ULTRAM) 50 MG tablet Take 1 tablet (50 mg total) every 6 (six) hours as needed by mouth for moderate pain. 10/26/17   Lattie Haw, MD    Family History Family History  Problem Relation Age of Onset  . Hypertension Mother     Social History Social History   Tobacco Use  . Smoking status: Never Smoker  . Smokeless tobacco: Never Used  Vaping Use  . Vaping Use: Never used  Substance Use Topics  . Alcohol use: No  . Drug use: No     Allergies   Percocet [oxycodone-acetaminophen]   Review of Systems Review of Systems   Physical Exam Triage Vital Signs ED Triage Vitals  Enc Vitals Group     BP 05/17/21 1325 (!) 144/92     Pulse Rate 05/17/21 1325 (!) 101     Resp 05/17/21 1325 18     Temp 05/17/21 1325 98.3 F (36.8 C)     Temp Source 05/17/21 1325 Oral     SpO2 05/17/21 1325 98 %     Weight --      Height --      Head Circumference --      Peak Flow --      Pain Score 05/17/21 1326 0     Pain Loc --      Pain Edu? --      Excl. in [place]? --    No data found.  Updated Vital Signs BP (!) 144/92   Pulse (!) 101   Temp 98.3 F (36.8 C) (Oral)   Resp 18   SpO2 98%   Visual Acuity Right Eye Distance:   Left Eye Distance:   Bilateral Distance:    Right Eye Near:   Left Eye Near:    Bilateral Near:     Physical Exam Vitals and nursing note reviewed.  Constitutional:      General: She is not in acute distress.     Appearance: Normal appearance. She is well-developed.  HENT:     Head: Normocephalic and atraumatic.     Nose: Nose normal.     Mouth/Throat:     Mouth: Mucous membranes are  moist.     Pharynx: Oropharynx is clear.  Eyes:     Extraocular Movements: Extraocular movements intact.     Conjunctiva/sclera: Conjunctivae normal.     Pupils: Pupils are equal, round, and reactive to light.  Cardiovascular:     Rate and Rhythm: Normal rate and regular rhythm.  Pulmonary:     Effort: Pulmonary effort is normal. No respiratory distress.  Abdominal:     General: Bowel sounds are normal. There is no distension.     Palpations: Abdomen is soft. There is no mass.     Tenderness: There is no abdominal tenderness. There is no right CVA tenderness, left CVA tenderness, guarding or rebound.     Hernia: No hernia is present.  Musculoskeletal:        General: Normal range of motion.     Cervical back: Normal range of motion and neck supple.  Skin:    General: Skin is warm and dry.     Capillary Refill: Capillary refill takes less than 2 seconds.     Findings: Lesion present.       Neurological:     General: No focal deficit present.     Mental Status: She is alert and oriented to person, place, and time.  Psychiatric:        Mood and Affect: Mood normal.        Behavior: Behavior normal.        Thought Content: Thought content normal.      UC Treatments / Results  Labs (all labs ordered are listed, but only abnormal results are displayed) Labs Reviewed - No data to display  EKG   Radiology No results found.  Procedures Procedures (including critical care time)  Medications Ordered in UC Medications - No data to display  Initial Impression / Assessment and Plan / UC Course  I have reviewed the triage vital signs and the nursing notes.  Pertinent labs & imaging results that were available during my care of the patient were reviewed by me and considered in my medical decision  making (see chart for details).    Cutaneous abscess of abdominal wall  Prescribe doxycycline 100 mg twice daily x7 days Follow up with this office or with primary care if symptoms are persisting.  Follow up in the ER for high fever, trouble swallowing, trouble breathing, other concerning symptoms.    Final Clinical Impressions(s) / UC Diagnoses   Final diagnoses:  Cutaneous abscess of abdominal wall     Discharge Instructions     I have sent in doxycycline for you to take one tablet twice a day for 10 days  Follow up with this office or with primary care if symptoms are persisting.  Follow up in the ER for high fever, trouble swallowing, trouble breathing, other concerning symptoms.     ED Prescriptions    Medication Sig Dispense Auth. Provider   doxycycline (VIBRAMYCIN) 100 MG capsule Take 1 capsule (100 mg total) by mouth 2 (two) times daily. 14 capsule Moshe Cipro, NP     PDMP not reviewed this encounter.   Moshe Cipro, NP 05/17/21 1523

## 2021-07-08 IMAGING — CR DG CHEST 2V
2 series · 2 of 2 positions shown · non-contrast
Comparison: 01/27/2016

CLINICAL DATA: Chest pain today.

EXAM:
CHEST - 2 VIEW

[chest pa]
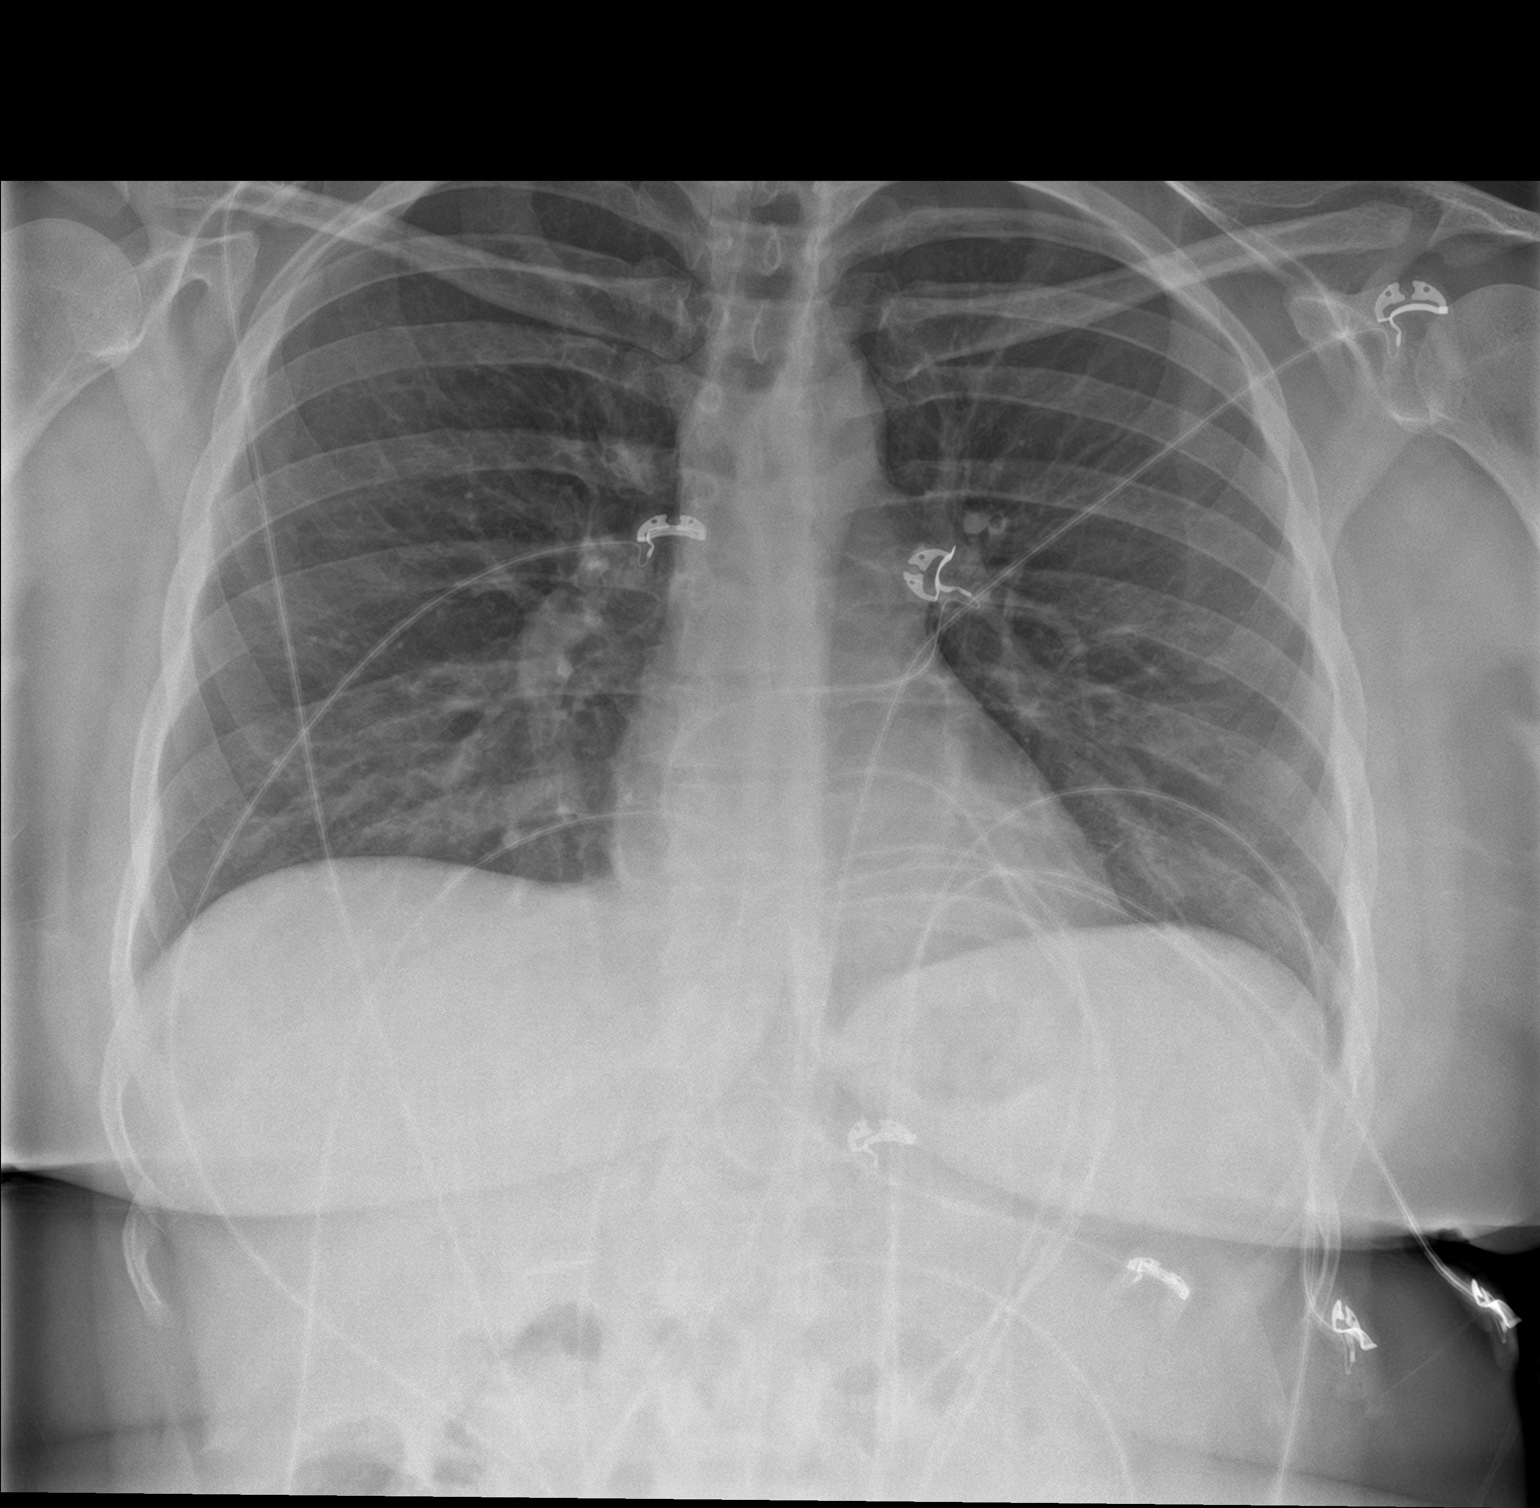

[chest lat]
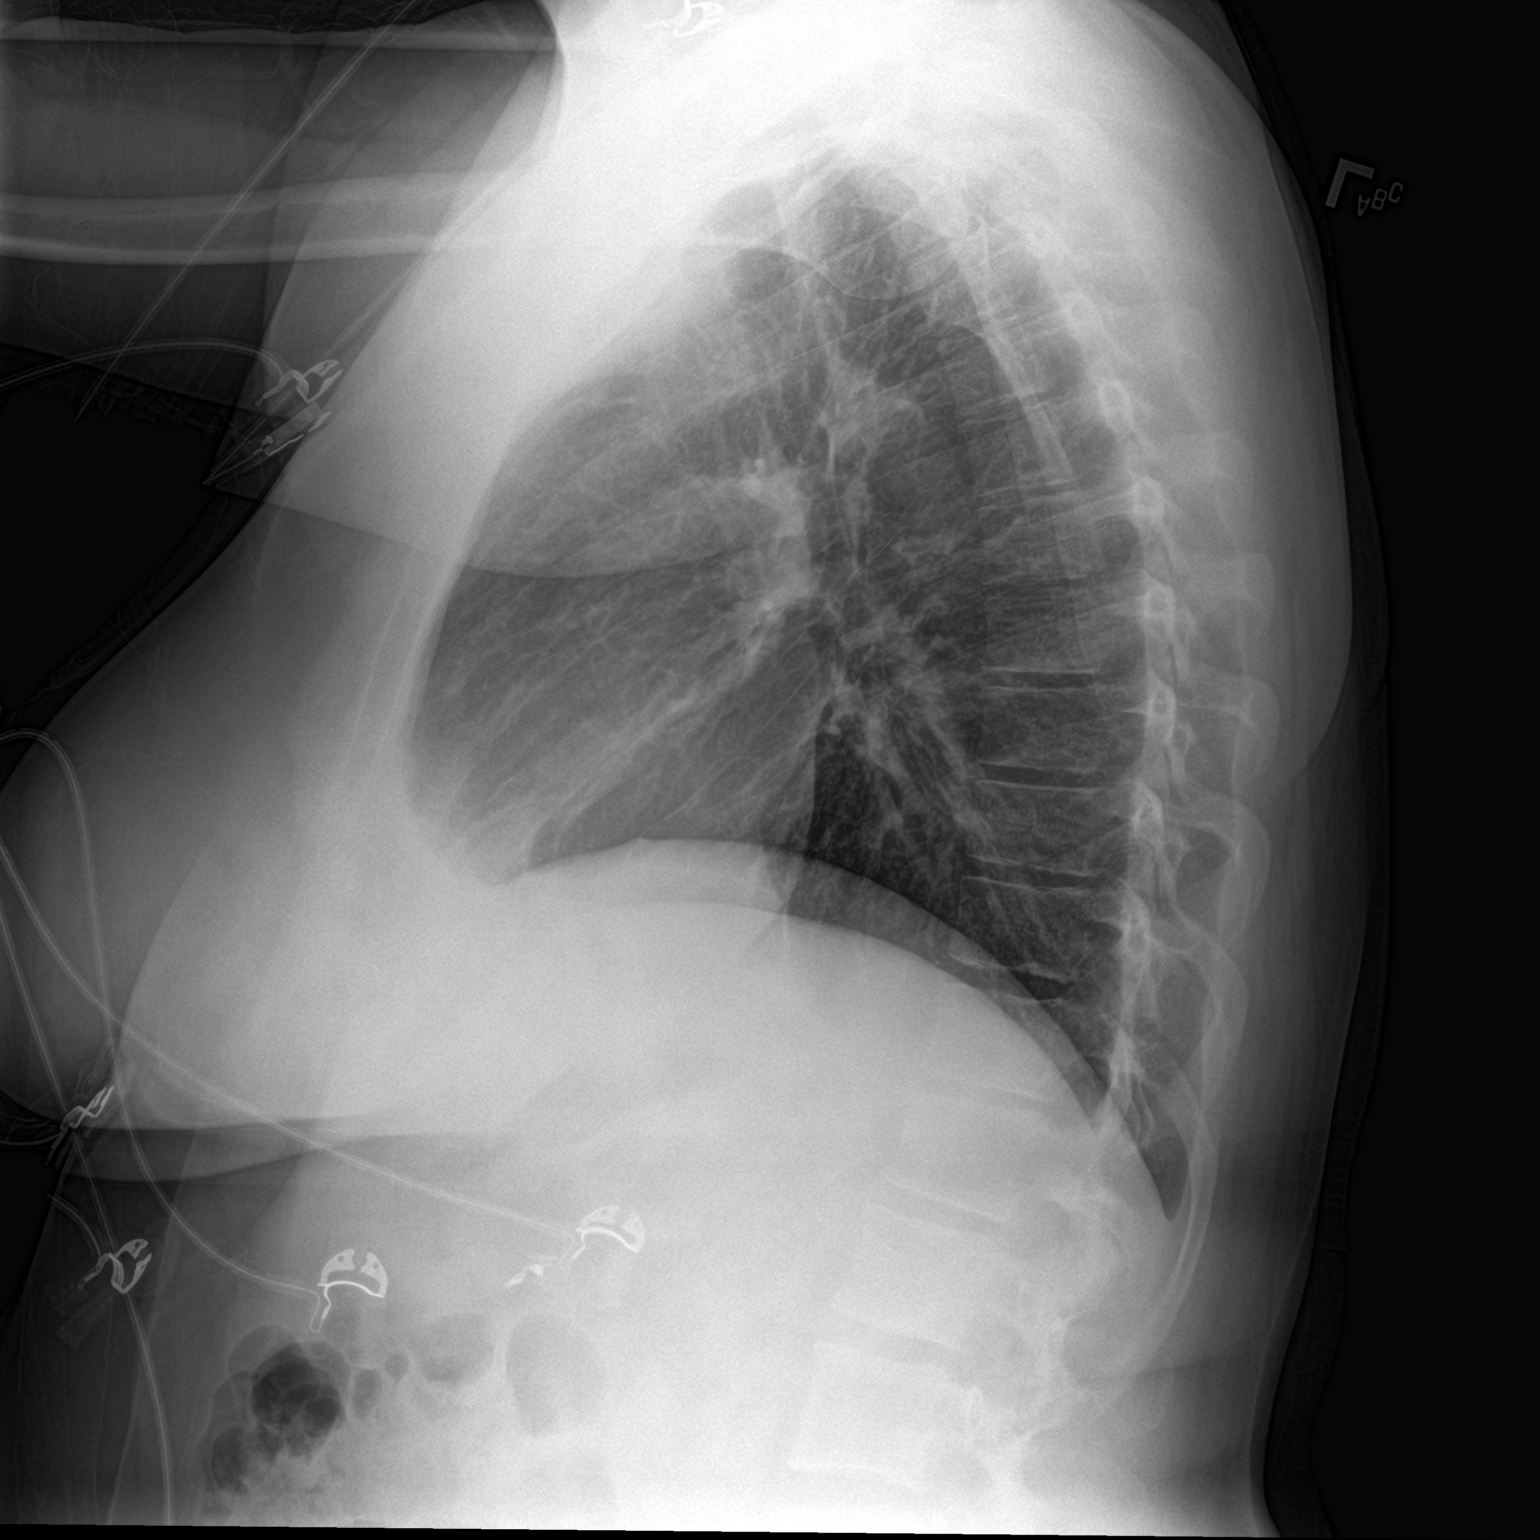

[2 of 2 positions shown; findings below may reference images not displayed]

FINDINGS: The cardiomediastinal contours are normal. The lungs are clear.
Pulmonary vasculature is normal. No consolidation, pleural effusion,
or pneumothorax. No acute osseous abnormalities are seen.
IMPRESSION: Unremarkable radiographs of the chest.

## 2023-09-03 ENCOUNTER — Ambulatory Visit: Payer: BC Managed Care – PPO
